# Patient Record
Sex: Female | Born: 1935 | Race: White | Hispanic: No | Marital: Married | Smoking: Never smoker
Health system: Southern US, Community
[De-identification: ages and names within clinical notes are randomized; demographics above are authoritative.]

## PROBLEM LIST (undated history)

## (undated) DIAGNOSIS — J45909 Unspecified asthma, uncomplicated: Secondary | ICD-10-CM

## (undated) DIAGNOSIS — Z9889 Other specified postprocedural states: Secondary | ICD-10-CM

## (undated) DIAGNOSIS — I10 Essential (primary) hypertension: Secondary | ICD-10-CM

## (undated) DIAGNOSIS — E7800 Pure hypercholesterolemia, unspecified: Secondary | ICD-10-CM

## (undated) HISTORY — PX: ABDOMINAL HYSTERECTOMY: SHX81

## (undated) HISTORY — PX: EYE SURGERY: SHX253

## (undated) HISTORY — PX: HERNIA REPAIR: SHX51

---

## 2005-04-05 ENCOUNTER — Ambulatory Visit: Payer: Self-pay | Admitting: Internal Medicine

## 2005-06-22 ENCOUNTER — Ambulatory Visit: Payer: Self-pay | Admitting: Internal Medicine

## 2006-02-02 ENCOUNTER — Ambulatory Visit: Payer: Self-pay | Admitting: Internal Medicine

## 2006-05-05 ENCOUNTER — Ambulatory Visit: Payer: Self-pay | Admitting: Internal Medicine

## 2007-03-02 ENCOUNTER — Ambulatory Visit: Payer: Self-pay | Admitting: Physician Assistant

## 2007-04-05 ENCOUNTER — Other Ambulatory Visit: Payer: Self-pay

## 2007-04-05 ENCOUNTER — Ambulatory Visit: Payer: Self-pay | Admitting: Unknown Physician Specialty

## 2007-04-14 ENCOUNTER — Inpatient Hospital Stay: Payer: Self-pay | Admitting: Unknown Physician Specialty

## 2007-04-14 HISTORY — PX: HEMIARTHROPLASTY SHOULDER FRACTURE: SUR653

## 2007-05-09 ENCOUNTER — Encounter: Payer: Self-pay | Admitting: Unknown Physician Specialty

## 2007-05-30 ENCOUNTER — Encounter: Payer: Self-pay | Admitting: Unknown Physician Specialty

## 2007-06-30 ENCOUNTER — Encounter: Payer: Self-pay | Admitting: Unknown Physician Specialty

## 2007-07-27 ENCOUNTER — Ambulatory Visit: Payer: Self-pay | Admitting: Internal Medicine

## 2008-07-29 ENCOUNTER — Ambulatory Visit: Payer: Self-pay | Admitting: Internal Medicine

## 2009-12-03 ENCOUNTER — Ambulatory Visit: Payer: Self-pay | Admitting: Internal Medicine

## 2010-02-17 ENCOUNTER — Emergency Department: Payer: Self-pay | Admitting: Emergency Medicine

## 2010-02-19 ENCOUNTER — Emergency Department: Payer: Self-pay | Admitting: Emergency Medicine

## 2010-02-25 ENCOUNTER — Ambulatory Visit: Payer: Self-pay | Admitting: Internal Medicine

## 2010-12-21 ENCOUNTER — Ambulatory Visit: Payer: Self-pay | Admitting: Internal Medicine

## 2011-06-26 ENCOUNTER — Emergency Department: Payer: Self-pay | Admitting: *Deleted

## 2011-12-29 ENCOUNTER — Ambulatory Visit: Payer: Self-pay | Admitting: Internal Medicine

## 2012-03-26 ENCOUNTER — Emergency Department: Payer: Self-pay | Admitting: Internal Medicine

## 2012-03-26 LAB — URINALYSIS, COMPLETE
Bacteria: NONE SEEN
Glucose,UR: NEGATIVE mg/dL (ref 0–75)
Ketone: NEGATIVE
Ph: 6 (ref 4.5–8.0)
Protein: 100
Specific Gravity: 1.014 (ref 1.003–1.030)
Squamous Epithelial: NONE SEEN
WBC UR: 395 /HPF (ref 0–5)

## 2012-12-29 ENCOUNTER — Ambulatory Visit: Payer: Self-pay | Admitting: Internal Medicine

## 2013-12-31 ENCOUNTER — Ambulatory Visit: Payer: Self-pay | Admitting: Internal Medicine

## 2015-11-10 ENCOUNTER — Other Ambulatory Visit: Payer: Self-pay | Admitting: Internal Medicine

## 2015-11-10 DIAGNOSIS — Z1231 Encounter for screening mammogram for malignant neoplasm of breast: Secondary | ICD-10-CM

## 2015-11-18 ENCOUNTER — Ambulatory Visit
Admission: RE | Admit: 2015-11-18 | Discharge: 2015-11-18 | Disposition: A | Discharge status: Home / Self Care | Payer: PPO | Source: Ambulatory Visit | Attending: Internal Medicine | Admitting: Internal Medicine

## 2015-11-18 DIAGNOSIS — Z1231 Encounter for screening mammogram for malignant neoplasm of breast: Secondary | ICD-10-CM | POA: Diagnosis present

## 2016-01-29 DIAGNOSIS — J019 Acute sinusitis, unspecified: Secondary | ICD-10-CM | POA: Diagnosis not present

## 2016-01-29 DIAGNOSIS — I10 Essential (primary) hypertension: Secondary | ICD-10-CM | POA: Diagnosis not present

## 2016-02-17 DIAGNOSIS — Z947 Corneal transplant status: Secondary | ICD-10-CM | POA: Diagnosis not present

## 2016-03-13 ENCOUNTER — Encounter: Payer: Self-pay | Admitting: Emergency Medicine

## 2016-03-13 ENCOUNTER — Emergency Department
Admission: EM | Admit: 2016-03-13 | Discharge: 2016-03-14 | Disposition: A | Discharge status: Home / Self Care | Payer: PPO | Attending: Emergency Medicine | Admitting: Emergency Medicine

## 2016-03-13 DIAGNOSIS — R197 Diarrhea, unspecified: Secondary | ICD-10-CM | POA: Diagnosis not present

## 2016-03-13 DIAGNOSIS — J45909 Unspecified asthma, uncomplicated: Secondary | ICD-10-CM | POA: Diagnosis not present

## 2016-03-13 DIAGNOSIS — R110 Nausea: Secondary | ICD-10-CM | POA: Diagnosis not present

## 2016-03-13 DIAGNOSIS — R1030 Lower abdominal pain, unspecified: Secondary | ICD-10-CM | POA: Diagnosis not present

## 2016-03-13 DIAGNOSIS — N200 Calculus of kidney: Secondary | ICD-10-CM | POA: Diagnosis not present

## 2016-03-13 DIAGNOSIS — J9811 Atelectasis: Secondary | ICD-10-CM | POA: Diagnosis not present

## 2016-03-13 DIAGNOSIS — I10 Essential (primary) hypertension: Secondary | ICD-10-CM | POA: Insufficient documentation

## 2016-03-13 HISTORY — DX: Essential (primary) hypertension: I10

## 2016-03-13 HISTORY — DX: Pure hypercholesterolemia, unspecified: E78.00

## 2016-03-13 HISTORY — DX: Unspecified asthma, uncomplicated: J45.909

## 2016-03-13 LAB — COMPREHENSIVE METABOLIC PANEL
ALK PHOS: 59 U/L (ref 38–126)
ALT: 25 U/L (ref 14–54)
ANION GAP: 6 (ref 5–15)
AST: 32 U/L (ref 15–41)
Albumin: 4.5 g/dL (ref 3.5–5.0)
BUN: 14 mg/dL (ref 6–20)
CALCIUM: 9.5 mg/dL (ref 8.9–10.3)
CHLORIDE: 104 mmol/L (ref 101–111)
CO2: 27 mmol/L (ref 22–32)
Creatinine, Ser: 0.66 mg/dL (ref 0.44–1.00)
GFR calc non Af Amer: >60 mL/min (ref >60)
Glucose, Bld: 143 mg/dL — ABNORMAL HIGH (ref 65–99)
Potassium: 4.4 mmol/L (ref 3.5–5.1)
SODIUM: 137 mmol/L (ref 135–145)
Total Bilirubin: 0.4 mg/dL (ref 0.3–1.2)
Total Protein: 7.9 g/dL (ref 6.5–8.1)

## 2016-03-13 LAB — CBC
HCT: 44.2 % (ref 35.0–47.0)
HEMOGLOBIN: 15 g/dL (ref 12.0–16.0)
MCH: 32.1 pg (ref 26.0–34.0)
MCHC: 33.9 g/dL (ref 32.0–36.0)
MCV: 94.5 fL (ref 80.0–100.0)
Platelets: 207 10*3/uL (ref 150–440)
RBC: 4.67 MIL/uL (ref 3.80–5.20)
RDW: 13 % (ref 11.5–14.5)
WBC: 8 10*3/uL (ref 3.6–11.0)

## 2016-03-13 LAB — URINALYSIS COMPLETE WITH MICROSCOPIC (ARMC ONLY)
Bacteria, UA: NONE SEEN
Bilirubin Urine: NEGATIVE
Glucose, UA: NEGATIVE mg/dL
HGB URINE DIPSTICK: NEGATIVE
KETONES UR: NEGATIVE mg/dL
Leukocytes, UA: NEGATIVE
Nitrite: NEGATIVE
PH: 6 (ref 5.0–8.0)
PROTEIN: NEGATIVE mg/dL
SPECIFIC GRAVITY, URINE: 1.017 (ref 1.005–1.030)

## 2016-03-13 LAB — LIPASE, BLOOD: Lipase: 25 U/L (ref 11–51)

## 2016-03-13 MED ORDER — ONDANSETRON 4 MG PO TBDP
4.0000 mg | ORAL_TABLET | Freq: Once | ORAL | Status: AC | PRN
  Administered 2016-03-13: 4 mg via ORAL

## 2016-03-13 MED ORDER — ONDANSETRON 4 MG PO TBDP
ORAL_TABLET | ORAL | Status: AC
  Administered 2016-03-13: 4 mg via ORAL
  Filled 2016-03-13: qty 1

## 2016-03-13 NOTE — ED Notes (Signed)
Pt presents to ED with diarrhea X4-5 and "gagging". Pt states she had a hernia repair that made her unable to vomit. Pt states she has been "gagging all day" like she "wants to throw up" but never does. Pt states her abdominal muscles are sore from heaving all day. Denies any other pain. Pt currently has no increased work of breathing or distress noted. Skin warm and dry.

## 2016-03-14 ENCOUNTER — Emergency Department: Payer: PPO

## 2016-03-14 DIAGNOSIS — J9811 Atelectasis: Secondary | ICD-10-CM | POA: Diagnosis not present

## 2016-03-14 DIAGNOSIS — N200 Calculus of kidney: Secondary | ICD-10-CM | POA: Diagnosis not present

## 2016-03-14 LAB — GASTROINTESTINAL PANEL BY PCR, STOOL (REPLACES STOOL CULTURE)
ASTROVIRUS: NOT DETECTED
Adenovirus F40/41: NOT DETECTED
CYCLOSPORA CAYETANENSIS: NOT DETECTED
Campylobacter species: NOT DETECTED
Cryptosporidium: NOT DETECTED
E. COLI O157: NOT DETECTED
ENTEROTOXIGENIC E COLI (ETEC): NOT DETECTED
Entamoeba histolytica: NOT DETECTED
Enteroaggregative E coli (EAEC): NOT DETECTED
Enteropathogenic E coli (EPEC): NOT DETECTED
Giardia lamblia: NOT DETECTED
NOROVIRUS GI/GII: NOT DETECTED
PLESIMONAS SHIGELLOIDES: NOT DETECTED
ROTAVIRUS A: DETECTED — AB
SAPOVIRUS (I, II, IV, AND V): NOT DETECTED
SHIGA LIKE TOXIN PRODUCING E COLI (STEC): NOT DETECTED
Salmonella species: NOT DETECTED
Shigella/Enteroinvasive E coli (EIEC): NOT DETECTED
Vibrio cholerae: NOT DETECTED
Vibrio species: NOT DETECTED
Yersinia enterocolitica: NOT DETECTED

## 2016-03-14 LAB — C DIFFICILE QUICK SCREEN W PCR REFLEX
C DIFFICILE (CDIFF) INTERP: NEGATIVE
C DIFFICILE (CDIFF) TOXIN: NEGATIVE
C DIFFICLE (CDIFF) ANTIGEN: NEGATIVE

## 2016-03-14 MED ORDER — DIATRIZOATE MEGLUMINE & SODIUM 66-10 % PO SOLN
15.0000 mL | Freq: Once | ORAL | Status: AC
  Administered 2016-03-14: 15 mL via ORAL
  Filled 2016-03-14: qty 30

## 2016-03-14 MED ORDER — IOPAMIDOL (ISOVUE-300) INJECTION 61%
100.0000 mL | Freq: Once | INTRAVENOUS | Status: AC | PRN
  Administered 2016-03-14: 100 mL via INTRAVENOUS

## 2016-03-14 MED ORDER — ONDANSETRON HCL 4 MG/2ML IJ SOLN
4.0000 mg | Freq: Once | INTRAMUSCULAR | Status: AC
  Administered 2016-03-14: 4 mg via INTRAVENOUS
  Filled 2016-03-14: qty 2

## 2016-03-14 MED ORDER — ONDANSETRON 4 MG PO TBDP: 4.0000 mg | ORAL_TABLET | Freq: Three times a day (TID) | ORAL | Status: DC | PRN

## 2016-03-14 MED ORDER — SODIUM CHLORIDE 0.9 % IV BOLUS (SEPSIS)
1000.0000 mL | Freq: Once | INTRAVENOUS | Status: AC
  Administered 2016-03-14: 1000 mL via INTRAVENOUS

## 2016-03-14 NOTE — ED Notes (Signed)
Patient transported to CT 

## 2016-03-14 NOTE — Discharge Instructions (Signed)
1. You may take Zofran as needed for nausea (#20). 2. Clear liquids 12 hours, then BRAT diet 3 days, then slowly advance diet as tolerated. 3. Return to the ER for worsening symptoms, persistent vomiting, difficulty breathing or other concerns.  Abdominal Pain, Adult Many things can cause abdominal pain. Usually, abdominal pain is not caused by a disease and will improve without treatment. It can often be observed and treated at home. Your health care provider will do a physical exam and possibly order blood tests and X-rays to help determine the seriousness of your pain. However, in many cases, more time must pass before a clear cause of the pain can be found. Before that point, your health care provider may not know if you need more testing or further treatment. HOME CARE INSTRUCTIONS Monitor your abdominal pain for any changes. The following actions may help to alleviate any discomfort you are experiencing:  Only take over-the-counter or prescription medicines as directed by your health care provider.  Do not take laxatives unless directed to do so by your health care provider.  Try a clear liquid diet (broth, tea, or water) as directed by your health care provider. Slowly move to a bland diet as tolerated. SEEK MEDICAL CARE IF:  You have unexplained abdominal pain.  You have abdominal pain associated with nausea or diarrhea.  You have pain when you urinate or have a bowel movement.  You experience abdominal pain that wakes you in the night.  You have abdominal pain that is worsened or improved by eating food.  You have abdominal pain that is worsened with eating fatty foods.  You have a fever. SEEK IMMEDIATE MEDICAL CARE IF:  Your pain does not go away within 2 hours.  You keep throwing up (vomiting).  Your pain is felt only in portions of the abdomen, such as the right side or the left lower portion of the abdomen.  You pass bloody or black tarry stools. MAKE SURE  YOU:  Understand these instructions.  Will watch your condition.  Will get help right away if you are not doing well or get worse.   This information is not intended to replace advice given to you by your health care provider. Make sure you discuss any questions you have with your health care provider.   Document Released: 08/25/2005 Document Revised: 08/06/2015 Document Reviewed: 07/25/2013 Elsevier Interactive Patient Education 2016 Elsevier Inc.  Diarrhea Diarrhea is frequent loose and watery bowel movements. It can cause you to feel weak and dehydrated. Dehydration can cause you to become tired and thirsty, have a dry mouth, and have decreased urination that often is dark yellow. Diarrhea is a sign of another problem, most often an infection that will not last long. In most cases, diarrhea typically lasts 2-3 days. However, it can last longer if it is a sign of something more serious. It is important to treat your diarrhea as directed by your caregiver to lessen or prevent future episodes of diarrhea. CAUSES  Some common causes include:  Gastrointestinal infections caused by viruses, bacteria, or parasites.  Food poisoning or food allergies.  Certain medicines, such as antibiotics, chemotherapy, and laxatives.  Artificial sweeteners and fructose.  Digestive disorders. HOME CARE INSTRUCTIONS  Ensure adequate fluid intake (hydration): Have 1 cup (8 oz) of fluid for each diarrhea episode. Avoid fluids that contain simple sugars or sports drinks, fruit juices, whole milk products, and sodas. Your urine should be clear or pale yellow if you are drinking enough fluids.  Hydrate with an oral rehydration solution that you can purchase at pharmacies, retail stores, and online. You can prepare an oral rehydration solution at home by mixing the following ingredients together:   - tsp table salt.   tsp baking soda.   tsp salt substitute containing potassium chloride.  1  tablespoons  sugar.  1 L (34 oz) of water.  Certain foods and beverages may increase the speed at which food moves through the gastrointestinal (GI) tract. These foods and beverages should be avoided and include:  Caffeinated and alcoholic beverages.  High-fiber foods, such as raw fruits and vegetables, nuts, seeds, and whole grain breads and cereals.  Foods and beverages sweetened with sugar alcohols, such as xylitol, sorbitol, and mannitol.  Some foods may be well tolerated and may help thicken stool including:  Starchy foods, such as rice, toast, pasta, low-sugar cereal, oatmeal, grits, baked potatoes, crackers, and bagels.  Bananas.  Applesauce.  Add probiotic-rich foods to help increase healthy bacteria in the GI tract, such as yogurt and fermented milk products.  Wash your hands well after each diarrhea episode.  Only take over-the-counter or prescription medicines as directed by your caregiver.  Take a warm bath to relieve any burning or pain from frequent diarrhea episodes. SEEK IMMEDIATE MEDICAL CARE IF:   You are unable to keep fluids down.  You have persistent vomiting.  You have blood in your stool, or your stools are black and tarry.  You do not urinate in 6-8 hours, or there is only a small amount of very dark urine.  You have abdominal pain that increases or localizes.  You have weakness, dizziness, confusion, or light-headedness.  You have a severe headache.  Your diarrhea gets worse or does not get better.  You have a fever or persistent symptoms for more than 2-3 days.  You have a fever and your symptoms suddenly get worse. MAKE SURE YOU:   Understand these instructions.  Will watch your condition.  Will get help right away if you are not doing well or get worse.   This information is not intended to replace advice given to you by your health care provider. Make sure you discuss any questions you have with your health care provider.   Document Released:  11/05/2002 Document Revised: 12/06/2014 Document Reviewed: 07/23/2012 Elsevier Interactive Patient Education 2016 [employer]  Food Choices to Help Relieve Diarrhea, Adult When you have diarrhea, the foods you eat and your eating habits are very important. Choosing the right foods and drinks can help relieve diarrhea. Also, because diarrhea can last up to 7 days, you need to replace lost fluids and electrolytes (such as sodium, potassium, and chloride) in order to help prevent dehydration.  WHAT GENERAL GUIDELINES DO I NEED TO FOLLOW?  Slowly drink 1 cup (8 oz) of fluid for each episode of diarrhea. If you are getting enough fluid, your urine will be clear or pale yellow.  Eat starchy foods. Some good choices include white rice, white toast, pasta, low-fiber cereal, baked potatoes (without the skin), saltine crackers, and bagels.  Avoid large servings of any cooked vegetables.  Limit fruit to two servings per day. A serving is  cup or 1 small piece.  Choose foods with less than 2 g of fiber per serving.  Limit fats to less than 8 tsp (38 g) per day.  Avoid fried foods.  Eat foods that have probiotics in them. Probiotics can be found in certain dairy products.  Avoid foods and beverages  that may increase the speed at which food moves through the stomach and intestines (gastrointestinal tract). Things to avoid include:  High-fiber foods, such as dried fruit, raw fruits and vegetables, nuts, seeds, and whole grain foods.  Spicy foods and high-fat foods.  Foods and beverages sweetened with high-fructose corn syrup, honey, or sugar alcohols such as xylitol, sorbitol, and mannitol. WHAT FOODS ARE RECOMMENDED? Grains White rice. White, [place], or pita breads (fresh or toasted), including plain rolls, buns, or bagels. White pasta. Saltine, soda, or graham crackers. Pretzels. Low-fiber cereal. Cooked cereals made with water (such as cornmeal, farina, or cream cereals). Plain muffins.  Matzo. Melba toast. Zwieback.  Vegetables Potatoes (without the skin). Strained tomato and vegetable juices. Most well-cooked and canned vegetables without seeds. Tender lettuce. Fruits Cooked or canned applesauce, apricots, cherries, fruit cocktail, grapefruit, peaches, pears, or plums. Fresh bananas, apples without skin, cherries, grapes, cantaloupe, grapefruit, peaches, oranges, or plums.  Meat and Other Protein Products Baked or boiled chicken. Eggs. Tofu. Fish. Seafood. Smooth peanut butter. Ground or well-cooked tender beef, ham, veal, lamb, pork, or poultry.  Dairy Plain yogurt, kefir, and unsweetened liquid yogurt. Lactose-free milk, buttermilk, or soy milk. Plain hard cheese. Beverages Sport drinks. Clear broths. Diluted fruit juices (except prune). Regular, caffeine-free sodas such as ginger ale. Water. Decaffeinated teas. Oral rehydration solutions. Sugar-free beverages not sweetened with sugar alcohols. Other Bouillon, broth, or soups made from recommended foods.  The items listed above may not be a complete list of recommended foods or beverages. Contact your dietitian for more options. WHAT FOODS ARE NOT RECOMMENDED? Grains Whole grain, whole wheat, bran, or rye breads, rolls, pastas, crackers, and cereals. Wild or brown rice. Cereals that contain more than 2 g of fiber per serving. Corn tortillas or taco shells. Cooked or dry oatmeal. Granola. Popcorn. Vegetables Raw vegetables. Cabbage, broccoli, Brussels sprouts, artichokes, baked beans, beet greens, corn, kale, legumes, peas, sweet potatoes, and yams. Potato skins. Cooked spinach and cabbage. Fruits Dried fruit, including raisins and dates. Raw fruits. Stewed or dried prunes. Fresh apples with skin, apricots, mangoes, pears, raspberries, and strawberries.  Meat and Other Protein Products Chunky peanut butter. Nuts and seeds. Beans and lentils. Tomasa Blase.  Dairy High-fat cheeses. Milk, chocolate milk, and beverages made with  milk, such as milk shakes. Cream. Ice cream. Sweets and Desserts Sweet rolls, doughnuts, and sweet breads. Pancakes and waffles. Fats and Oils Butter. Cream sauces. Margarine. Salad oils. Plain salad dressings. Olives. Avocados.  Beverages Caffeinated beverages (such as coffee, tea, soda, or energy drinks). Alcoholic beverages. Fruit juices with pulp. Prune juice. Soft drinks sweetened with high-fructose corn syrup or sugar alcohols. Other Coconut. Hot sauce. Chili powder. Mayonnaise. Gravy. Cream-based or milk-based soups.  The items listed above may not be a complete list of foods and beverages to avoid. Contact your dietitian for more information. WHAT SHOULD I DO IF I BECOME DEHYDRATED? Diarrhea can sometimes lead to dehydration. Signs of dehydration include dark urine and dry mouth and skin. If you think you are dehydrated, you should rehydrate with an oral rehydration solution. These solutions can be purchased at pharmacies, retail stores, or online.  Drink -1 cup (120-240 mL) of oral rehydration solution each time you have an episode of diarrhea. If drinking this amount makes your diarrhea worse, try drinking smaller amounts more often. For example, drink 1-3 tsp (5-15 mL) every 5-10 minutes.  A general rule for staying hydrated is to drink 1-2 L of fluid per day. Talk to your health care  provider about the specific amount you should be drinking each day. Drink enough fluids to keep your urine clear or pale yellow.   This information is not intended to replace advice given to you by your health care provider. Make sure you discuss any questions you have with your health care provider.   Document Released: 02/05/2004 Document Revised: 12/06/2014 Document Reviewed: 10/08/2013 Elsevier Interactive Patient Education 2016 Elsevier Inc.  Nausea, Adult Nausea is the feeling that you have an upset stomach or have to vomit. Nausea by itself is not likely a serious concern, but it may be an  early sign of more serious medical problems. As nausea gets worse, it can lead to vomiting. If vomiting develops, there is the risk of dehydration.  CAUSES   Viral infections.  Food poisoning.  Medicines.  Pregnancy.  Motion sickness.  Migraine headaches.  Emotional distress.  Severe pain from any source.  Alcohol intoxication. HOME CARE INSTRUCTIONS  Get plenty of rest.  Ask your caregiver about specific rehydration instructions.  Eat small amounts of food and sip liquids more often.  Take all medicines as told by your caregiver. SEEK MEDICAL CARE IF:  You have not improved after 2 days, or you get worse.  You have a headache. SEEK IMMEDIATE MEDICAL CARE IF:   You have a fever.  You faint.  You keep vomiting or have blood in your vomit.  You are extremely weak or dehydrated.  You have dark or bloody stools.  You have severe chest or abdominal pain. MAKE SURE YOU:  Understand these instructions.  Will watch your condition.  Will get help right away if you are not doing well or get worse.   This information is not intended to replace advice given to you by your health care provider. Make sure you discuss any questions you have with your health care provider.   Document Released: 12/23/2004 Document Revised: 12/06/2014 Document Reviewed: 07/28/2011 Elsevier Interactive Patient Education [employer]

## 2016-03-14 NOTE — ED Provider Notes (Signed)
[facility] Emergency Department Provider Note  ____________________________________________  Time seen: Approximately 12:06 AM  I have reviewed the triage vital signs and the nursing notes.   HISTORY  Chief Complaint Diarrhea and Emesis    HPI Christie Holmes is a 82 y.o. female who presents to the ED from home with a chief complain of nausea, dry heaving and diarrhea. Patient reports she awoke approximately midnight 24 hours ago with nausea. She states she is unable to vomit secondary to hiatal hernia repair in the past. Reports she has been "gagging" all day. Symptoms associated with 4-5 episodes of diarrhea and lower abdominal cramping. Denies associated fever, chills, chest pain, shortness of breath, dysuria. Denies recent travel or trauma. Also complains of dry cough. Received Zofran in waiting room which has improved her nausea; last episode of diarrhea approximately 6 PM.   Past Medical History  Diagnosis Date  . Hypertension   . Asthma   . Hypercholesteremia     There are no active problems to display for this patient.   Past Surgical History  Procedure Laterality Date  . Hernia repair    . Abdominal hysterectomy      No current outpatient prescriptions on file.  Allergies Review of patient's allergies indicates no known allergies.  Family History  Problem Relation Age of Onset  . Breast cancer Neg Hx     Social History Social History  Substance Use Topics  . Smoking status: Never Smoker   . Smokeless tobacco: None  . Alcohol Use: No    Review of Systems  Constitutional: No fever/chills. Eyes: No visual changes. ENT: No sore throat. Cardiovascular: Denies chest pain. Respiratory: Positive for dry cough. Denies shortness of breath. Gastrointestinal: Positive for lower abdominal pain, nausea and diarrhea.  No constipation. Genitourinary: Negative for dysuria. Musculoskeletal: Negative for back pain. Skin: Negative for  rash. Neurological: Negative for headaches, focal weakness or numbness.  10-point ROS otherwise negative.  ____________________________________________   PHYSICAL EXAM:  VITAL SIGNS: ED Triage Vitals  Enc Vitals Group     BP 03/13/16 1923 151/79 mmHg     Pulse Rate 03/13/16 1923 109     Resp 03/13/16 1923 18     Temp 03/13/16 1923 97.6 F (36.4 C)     Temp Source 03/13/16 1923 Oral     SpO2 03/13/16 1923 97 %     Weight 03/13/16 1923 143 lb (64.864 kg)     Height 03/13/16 1923 4\' 9"  (1.448 m)     Head Cir --      Peak Flow --      Pain Score 03/13/16 1918 9     Pain Loc --      Pain Edu? --      Excl. in GC? --     Constitutional: Alert and oriented. Well appearing and in no acute distress. Eyes: Conjunctivae are normal. PERRL. EOMI. Head: Atraumatic. Nose: No congestion/rhinnorhea. Mouth/Throat: Mucous membranes are mildly dry.  Oropharynx non-erythematous. Neck: No stridor.  No carotid bruits. Cardiovascular: Normal rate, regular rhythm. Grossly normal heart sounds.  Good peripheral circulation. Respiratory: Normal respiratory effort.  No retractions. Lungs CTAB. Gastrointestinal: Soft and nontender. No distention. No abdominal bruits. No CVA tenderness. Musculoskeletal: No lower extremity tenderness nor edema.  No joint effusions. Neurologic:  Normal speech and language. No gross focal neurologic deficits are appreciated. No gait instability. Skin:  Skin is warm, dry and intact. No rash noted. Psychiatric: Mood and affect are normal. Speech and behavior are  normal.  ____________________________________________   LABS (all labs ordered are listed, but only abnormal results are displayed)  Labs Reviewed  COMPREHENSIVE METABOLIC PANEL - Abnormal; Notable for the following:    Glucose, Bld 143 (*)    All other components within normal limits  URINALYSIS COMPLETEWITH MICROSCOPIC ([facility] ONLY) - Abnormal; Notable for the following:    Color, Urine YELLOW (*)     APPearance CLEAR (*)    Squamous Epithelial / LPF 0-5 (*)    All other components within normal limits  GASTROINTESTINAL PANEL BY PCR, STOOL (REPLACES STOOL CULTURE)  C DIFFICILE QUICK SCREEN W PCR REFLEX  LIPASE, BLOOD  CBC   ____________________________________________  EKG  ED ECG REPORT I, Keishaun Hazel J, the attending physician, personally viewed and interpreted this ECG.   Date: 03/14/2016  EKG Time: 0049  Rate: 105  Rhythm: sinus tachycardia  Axis: Normal  Intervals:none  ST&T Change: Nonspecific  ____________________________________________  RADIOLOGY  Chest 2 view (viewed by me, interpreted per Dr. Cherly Hensen): 1. Minimal bibasilar atelectasis noted. Lungs otherwise grossly clear. 2. Stomach partially distended with fluid and air.  CT Abdomen/Pelvis interpreted per Dr. Cherly Hensen: 1. No acute abnormality seen to explain the patient's symptoms. 2. 2 mm nonobstructing stone at the interpole region of the left right kidney. Left renal cysts noted. 3. Mild calcification along the abdominal aorta. 4. Minimal degenerative change along the lower thoracic and lumbar spine. ____________________________________________   PROCEDURES  Procedure(s) performed: None  Critical Care performed: No  ____________________________________________   INITIAL IMPRESSION / ASSESSMENT AND PLAN / ED COURSE  Pertinent labs & imaging results that were available during my care of the patient were reviewed by me and considered in my medical decision making (see chart for details).  82 year old female who presents with nausea and diarrhea. Laboratory and urinalysis results unremarkable. Given patient's history of abdominal surgeries, will obtain CT abdomen/pelvis to evaluate for intra-abdominal pathology.  ----------------------------------------- 2:39 AM on 03/14/2016 -----------------------------------------  Patient resting in no acute distress. Updated patient and daughter of CT  findings. Plan for Zofran prescription and close follow-up with PCP. Patient has been unable to provide stool sample while in the emergency department. Strict return precautions given. Both verbalize understanding and agree with plan of care. ____________________________________________   FINAL CLINICAL IMPRESSION(S) / ED DIAGNOSES  Final diagnoses:  Lower abdominal pain  Diarrhea, unspecified type  Nausea      Irean Hong, MD 03/14/16 [phone]

## 2016-03-15 DIAGNOSIS — K529 Noninfective gastroenteritis and colitis, unspecified: Secondary | ICD-10-CM | POA: Diagnosis not present

## 2016-04-27 DIAGNOSIS — E7800 Pure hypercholesterolemia, unspecified: Secondary | ICD-10-CM | POA: Diagnosis not present

## 2016-04-27 DIAGNOSIS — E538 Deficiency of other specified B group vitamins: Secondary | ICD-10-CM | POA: Diagnosis not present

## 2016-04-27 DIAGNOSIS — I10 Essential (primary) hypertension: Secondary | ICD-10-CM | POA: Diagnosis not present

## 2016-04-27 DIAGNOSIS — I700 Atherosclerosis of aorta: Secondary | ICD-10-CM | POA: Diagnosis not present

## 2016-05-04 DIAGNOSIS — I700 Atherosclerosis of aorta: Secondary | ICD-10-CM | POA: Diagnosis not present

## 2016-05-04 DIAGNOSIS — F325 Major depressive disorder, single episode, in full remission: Secondary | ICD-10-CM | POA: Diagnosis not present

## 2016-05-04 DIAGNOSIS — I10 Essential (primary) hypertension: Secondary | ICD-10-CM | POA: Diagnosis not present

## 2016-05-04 DIAGNOSIS — J4520 Mild intermittent asthma, uncomplicated: Secondary | ICD-10-CM | POA: Diagnosis not present

## 2016-05-04 DIAGNOSIS — E538 Deficiency of other specified B group vitamins: Secondary | ICD-10-CM | POA: Diagnosis not present

## 2016-05-04 DIAGNOSIS — E7800 Pure hypercholesterolemia, unspecified: Secondary | ICD-10-CM | POA: Diagnosis not present

## 2016-07-01 DIAGNOSIS — M79672 Pain in left foot: Secondary | ICD-10-CM | POA: Diagnosis not present

## 2016-07-01 DIAGNOSIS — M7672 Peroneal tendinitis, left leg: Secondary | ICD-10-CM | POA: Diagnosis not present

## 2016-08-17 DIAGNOSIS — Z947 Corneal transplant status: Secondary | ICD-10-CM | POA: Diagnosis not present

## 2016-09-13 DIAGNOSIS — Z23 Encounter for immunization: Secondary | ICD-10-CM | POA: Diagnosis not present

## 2016-11-02 DIAGNOSIS — E538 Deficiency of other specified B group vitamins: Secondary | ICD-10-CM | POA: Diagnosis not present

## 2016-11-02 DIAGNOSIS — E7800 Pure hypercholesterolemia, unspecified: Secondary | ICD-10-CM | POA: Diagnosis not present

## 2016-11-02 DIAGNOSIS — I10 Essential (primary) hypertension: Secondary | ICD-10-CM | POA: Diagnosis not present

## 2016-11-09 DIAGNOSIS — E7800 Pure hypercholesterolemia, unspecified: Secondary | ICD-10-CM | POA: Diagnosis not present

## 2016-11-09 DIAGNOSIS — Z0000 Encounter for general adult medical examination without abnormal findings: Secondary | ICD-10-CM | POA: Diagnosis not present

## 2016-11-09 DIAGNOSIS — I10 Essential (primary) hypertension: Secondary | ICD-10-CM | POA: Diagnosis not present

## 2016-11-09 DIAGNOSIS — I700 Atherosclerosis of aorta: Secondary | ICD-10-CM | POA: Diagnosis not present

## 2016-11-09 DIAGNOSIS — Z8659 Personal history of other mental and behavioral disorders: Secondary | ICD-10-CM | POA: Diagnosis not present

## 2016-11-09 DIAGNOSIS — E538 Deficiency of other specified B group vitamins: Secondary | ICD-10-CM | POA: Diagnosis not present

## 2017-02-10 DIAGNOSIS — R8290 Unspecified abnormal findings in urine: Secondary | ICD-10-CM | POA: Diagnosis not present

## 2017-02-10 DIAGNOSIS — R300 Dysuria: Secondary | ICD-10-CM | POA: Diagnosis not present

## 2017-03-28 DIAGNOSIS — Z947 Corneal transplant status: Secondary | ICD-10-CM | POA: Diagnosis not present

## 2017-03-31 DIAGNOSIS — H60331 Swimmer's ear, right ear: Secondary | ICD-10-CM | POA: Diagnosis not present

## 2017-03-31 DIAGNOSIS — R309 Painful micturition, unspecified: Secondary | ICD-10-CM | POA: Diagnosis not present

## 2017-03-31 DIAGNOSIS — H6121 Impacted cerumen, right ear: Secondary | ICD-10-CM | POA: Diagnosis not present

## 2017-05-03 DIAGNOSIS — E538 Deficiency of other specified B group vitamins: Secondary | ICD-10-CM | POA: Diagnosis not present

## 2017-05-03 DIAGNOSIS — E7800 Pure hypercholesterolemia, unspecified: Secondary | ICD-10-CM | POA: Diagnosis not present

## 2017-05-03 DIAGNOSIS — Z0000 Encounter for general adult medical examination without abnormal findings: Secondary | ICD-10-CM | POA: Diagnosis not present

## 2017-05-03 DIAGNOSIS — I10 Essential (primary) hypertension: Secondary | ICD-10-CM | POA: Diagnosis not present

## 2017-05-10 DIAGNOSIS — I10 Essential (primary) hypertension: Secondary | ICD-10-CM | POA: Diagnosis not present

## 2017-05-10 DIAGNOSIS — I700 Atherosclerosis of aorta: Secondary | ICD-10-CM | POA: Diagnosis not present

## 2017-05-10 DIAGNOSIS — J45909 Unspecified asthma, uncomplicated: Secondary | ICD-10-CM | POA: Diagnosis not present

## 2017-05-10 DIAGNOSIS — Z0000 Encounter for general adult medical examination without abnormal findings: Secondary | ICD-10-CM | POA: Diagnosis not present

## 2017-05-10 DIAGNOSIS — E7800 Pure hypercholesterolemia, unspecified: Secondary | ICD-10-CM | POA: Diagnosis not present

## 2017-05-10 DIAGNOSIS — E538 Deficiency of other specified B group vitamins: Secondary | ICD-10-CM | POA: Diagnosis not present

## 2017-05-30 DIAGNOSIS — H60541 Acute eczematoid otitis externa, right ear: Secondary | ICD-10-CM | POA: Diagnosis not present

## 2017-10-10 DIAGNOSIS — Z961 Presence of intraocular lens: Secondary | ICD-10-CM | POA: Diagnosis not present

## 2017-11-02 DIAGNOSIS — I10 Essential (primary) hypertension: Secondary | ICD-10-CM | POA: Diagnosis not present

## 2017-11-02 DIAGNOSIS — E7800 Pure hypercholesterolemia, unspecified: Secondary | ICD-10-CM | POA: Diagnosis not present

## 2017-11-02 DIAGNOSIS — E538 Deficiency of other specified B group vitamins: Secondary | ICD-10-CM | POA: Diagnosis not present

## 2017-11-02 DIAGNOSIS — I700 Atherosclerosis of aorta: Secondary | ICD-10-CM | POA: Diagnosis not present

## 2017-11-14 DIAGNOSIS — J45909 Unspecified asthma, uncomplicated: Secondary | ICD-10-CM | POA: Diagnosis not present

## 2017-11-14 DIAGNOSIS — I10 Essential (primary) hypertension: Secondary | ICD-10-CM | POA: Diagnosis not present

## 2017-11-14 DIAGNOSIS — I700 Atherosclerosis of aorta: Secondary | ICD-10-CM | POA: Diagnosis not present

## 2017-11-14 DIAGNOSIS — E7800 Pure hypercholesterolemia, unspecified: Secondary | ICD-10-CM | POA: Diagnosis not present

## 2018-01-16 ENCOUNTER — Other Ambulatory Visit: Payer: Self-pay | Admitting: Physician Assistant

## 2018-01-16 ENCOUNTER — Ambulatory Visit
Admission: RE | Admit: 2018-01-16 | Discharge: 2018-01-16 | Disposition: A | Discharge status: Home / Self Care | Payer: PPO | Source: Ambulatory Visit | Attending: Physician Assistant | Admitting: Physician Assistant

## 2018-01-16 DIAGNOSIS — N200 Calculus of kidney: Secondary | ICD-10-CM | POA: Diagnosis not present

## 2018-01-16 DIAGNOSIS — R109 Unspecified abdominal pain: Secondary | ICD-10-CM | POA: Diagnosis not present

## 2018-01-16 DIAGNOSIS — R1031 Right lower quadrant pain: Secondary | ICD-10-CM | POA: Diagnosis not present

## 2018-01-16 DIAGNOSIS — R350 Frequency of micturition: Secondary | ICD-10-CM | POA: Diagnosis not present

## 2018-01-16 DIAGNOSIS — I700 Atherosclerosis of aorta: Secondary | ICD-10-CM | POA: Diagnosis not present

## 2018-02-23 DIAGNOSIS — J00 Acute nasopharyngitis [common cold]: Secondary | ICD-10-CM | POA: Diagnosis not present

## 2018-04-11 DIAGNOSIS — Z947 Corneal transplant status: Secondary | ICD-10-CM | POA: Diagnosis not present

## 2018-05-11 DIAGNOSIS — I700 Atherosclerosis of aorta: Secondary | ICD-10-CM | POA: Diagnosis not present

## 2018-05-11 DIAGNOSIS — I10 Essential (primary) hypertension: Secondary | ICD-10-CM | POA: Diagnosis not present

## 2018-05-11 DIAGNOSIS — E7800 Pure hypercholesterolemia, unspecified: Secondary | ICD-10-CM | POA: Diagnosis not present

## 2018-05-18 DIAGNOSIS — I700 Atherosclerosis of aorta: Secondary | ICD-10-CM | POA: Diagnosis not present

## 2018-05-18 DIAGNOSIS — E7800 Pure hypercholesterolemia, unspecified: Secondary | ICD-10-CM | POA: Diagnosis not present

## 2018-05-18 DIAGNOSIS — J45909 Unspecified asthma, uncomplicated: Secondary | ICD-10-CM | POA: Diagnosis not present

## 2018-05-18 DIAGNOSIS — R350 Frequency of micturition: Secondary | ICD-10-CM | POA: Diagnosis not present

## 2018-05-18 DIAGNOSIS — I10 Essential (primary) hypertension: Secondary | ICD-10-CM | POA: Diagnosis not present

## 2018-05-18 DIAGNOSIS — Z0000 Encounter for general adult medical examination without abnormal findings: Secondary | ICD-10-CM | POA: Diagnosis not present

## 2018-06-19 DIAGNOSIS — E7800 Pure hypercholesterolemia, unspecified: Secondary | ICD-10-CM | POA: Diagnosis not present

## 2018-06-19 DIAGNOSIS — I10 Essential (primary) hypertension: Secondary | ICD-10-CM | POA: Diagnosis not present

## 2018-06-19 DIAGNOSIS — E538 Deficiency of other specified B group vitamins: Secondary | ICD-10-CM | POA: Diagnosis not present

## 2018-06-19 DIAGNOSIS — J9811 Atelectasis: Secondary | ICD-10-CM | POA: Diagnosis not present

## 2018-06-19 DIAGNOSIS — J45909 Unspecified asthma, uncomplicated: Secondary | ICD-10-CM | POA: Diagnosis not present

## 2018-06-19 DIAGNOSIS — I700 Atherosclerosis of aorta: Secondary | ICD-10-CM | POA: Diagnosis not present

## 2018-07-25 DIAGNOSIS — R6889 Other general symptoms and signs: Secondary | ICD-10-CM | POA: Diagnosis not present

## 2018-07-25 DIAGNOSIS — R040 Epistaxis: Secondary | ICD-10-CM | POA: Diagnosis not present

## 2018-07-27 DIAGNOSIS — R0982 Postnasal drip: Secondary | ICD-10-CM | POA: Diagnosis not present

## 2018-07-27 DIAGNOSIS — R490 Dysphonia: Secondary | ICD-10-CM | POA: Diagnosis not present

## 2018-07-27 DIAGNOSIS — R040 Epistaxis: Secondary | ICD-10-CM | POA: Diagnosis not present

## 2018-10-09 DIAGNOSIS — Z961 Presence of intraocular lens: Secondary | ICD-10-CM | POA: Diagnosis not present

## 2018-11-09 DIAGNOSIS — E7800 Pure hypercholesterolemia, unspecified: Secondary | ICD-10-CM | POA: Diagnosis not present

## 2018-11-09 DIAGNOSIS — I700 Atherosclerosis of aorta: Secondary | ICD-10-CM | POA: Diagnosis not present

## 2018-11-09 DIAGNOSIS — I10 Essential (primary) hypertension: Secondary | ICD-10-CM | POA: Diagnosis not present

## 2018-11-16 DIAGNOSIS — I10 Essential (primary) hypertension: Secondary | ICD-10-CM | POA: Diagnosis not present

## 2018-11-16 DIAGNOSIS — I700 Atherosclerosis of aorta: Secondary | ICD-10-CM | POA: Diagnosis not present

## 2018-11-16 DIAGNOSIS — J45909 Unspecified asthma, uncomplicated: Secondary | ICD-10-CM | POA: Diagnosis not present

## 2018-11-16 DIAGNOSIS — E538 Deficiency of other specified B group vitamins: Secondary | ICD-10-CM | POA: Diagnosis not present

## 2018-11-16 DIAGNOSIS — E7800 Pure hypercholesterolemia, unspecified: Secondary | ICD-10-CM | POA: Diagnosis not present

## 2019-01-25 DIAGNOSIS — J310 Chronic rhinitis: Secondary | ICD-10-CM | POA: Diagnosis not present

## 2019-01-25 DIAGNOSIS — J45909 Unspecified asthma, uncomplicated: Secondary | ICD-10-CM | POA: Diagnosis not present

## 2019-02-05 DIAGNOSIS — J45901 Unspecified asthma with (acute) exacerbation: Secondary | ICD-10-CM | POA: Diagnosis not present

## 2019-02-05 DIAGNOSIS — J4541 Moderate persistent asthma with (acute) exacerbation: Secondary | ICD-10-CM | POA: Diagnosis not present

## 2019-02-26 ENCOUNTER — Other Ambulatory Visit: Payer: Self-pay | Admitting: Internal Medicine

## 2019-02-26 DIAGNOSIS — R1310 Dysphagia, unspecified: Secondary | ICD-10-CM | POA: Diagnosis not present

## 2019-02-26 DIAGNOSIS — J45901 Unspecified asthma with (acute) exacerbation: Secondary | ICD-10-CM

## 2019-02-28 DIAGNOSIS — N183 Chronic kidney disease, stage 3 unspecified: Secondary | ICD-10-CM | POA: Diagnosis not present

## 2019-02-28 DIAGNOSIS — E1122 Type 2 diabetes mellitus with diabetic chronic kidney disease: Secondary | ICD-10-CM | POA: Diagnosis not present

## 2019-02-28 DIAGNOSIS — J4521 Mild intermittent asthma with (acute) exacerbation: Secondary | ICD-10-CM | POA: Diagnosis not present

## 2019-02-28 DIAGNOSIS — D649 Anemia, unspecified: Secondary | ICD-10-CM | POA: Diagnosis not present

## 2019-02-28 DIAGNOSIS — J449 Chronic obstructive pulmonary disease, unspecified: Secondary | ICD-10-CM | POA: Diagnosis not present

## 2019-02-28 DIAGNOSIS — E782 Mixed hyperlipidemia: Secondary | ICD-10-CM | POA: Diagnosis not present

## 2019-02-28 DIAGNOSIS — D696 Thrombocytopenia, unspecified: Secondary | ICD-10-CM | POA: Diagnosis not present

## 2019-02-28 DIAGNOSIS — I2510 Atherosclerotic heart disease of native coronary artery without angina pectoris: Secondary | ICD-10-CM | POA: Diagnosis not present

## 2019-02-28 DIAGNOSIS — F17218 Nicotine dependence, cigarettes, with other nicotine-induced disorders: Secondary | ICD-10-CM | POA: Diagnosis not present

## 2019-02-28 DIAGNOSIS — N400 Enlarged prostate without lower urinary tract symptoms: Secondary | ICD-10-CM | POA: Diagnosis not present

## 2019-02-28 DIAGNOSIS — K760 Fatty (change of) liver, not elsewhere classified: Secondary | ICD-10-CM | POA: Diagnosis not present

## 2019-02-28 DIAGNOSIS — R0602 Shortness of breath: Secondary | ICD-10-CM | POA: Diagnosis not present

## 2019-02-28 DIAGNOSIS — E039 Hypothyroidism, unspecified: Secondary | ICD-10-CM | POA: Diagnosis not present

## 2019-02-28 DIAGNOSIS — I129 Hypertensive chronic kidney disease with stage 1 through stage 4 chronic kidney disease, or unspecified chronic kidney disease: Secondary | ICD-10-CM | POA: Diagnosis not present

## 2019-03-19 DIAGNOSIS — J310 Chronic rhinitis: Secondary | ICD-10-CM | POA: Diagnosis not present

## 2019-03-19 DIAGNOSIS — R42 Dizziness and giddiness: Secondary | ICD-10-CM | POA: Diagnosis not present

## 2019-03-19 DIAGNOSIS — J45901 Unspecified asthma with (acute) exacerbation: Secondary | ICD-10-CM | POA: Diagnosis not present

## 2019-03-19 DIAGNOSIS — R0602 Shortness of breath: Secondary | ICD-10-CM | POA: Diagnosis not present

## 2019-03-19 DIAGNOSIS — J4520 Mild intermittent asthma, uncomplicated: Secondary | ICD-10-CM | POA: Diagnosis not present

## 2019-04-03 ENCOUNTER — Ambulatory Visit: Payer: PPO

## 2019-04-10 ENCOUNTER — Other Ambulatory Visit: Payer: Self-pay

## 2019-04-10 ENCOUNTER — Other Ambulatory Visit: Payer: Self-pay | Admitting: Internal Medicine

## 2019-04-10 ENCOUNTER — Ambulatory Visit
Admission: RE | Admit: 2019-04-10 | Discharge: 2019-04-10 | Disposition: A | Discharge status: Home / Self Care | Payer: PPO | Source: Ambulatory Visit | Attending: Internal Medicine | Admitting: Internal Medicine

## 2019-04-10 DIAGNOSIS — R1310 Dysphagia, unspecified: Secondary | ICD-10-CM | POA: Diagnosis not present

## 2019-04-10 DIAGNOSIS — J45901 Unspecified asthma with (acute) exacerbation: Secondary | ICD-10-CM

## 2019-04-10 DIAGNOSIS — K449 Diaphragmatic hernia without obstruction or gangrene: Secondary | ICD-10-CM | POA: Diagnosis not present

## 2019-04-10 DIAGNOSIS — K224 Dyskinesia of esophagus: Secondary | ICD-10-CM | POA: Diagnosis not present

## 2019-05-14 DIAGNOSIS — E7800 Pure hypercholesterolemia, unspecified: Secondary | ICD-10-CM | POA: Diagnosis not present

## 2019-05-14 DIAGNOSIS — I10 Essential (primary) hypertension: Secondary | ICD-10-CM | POA: Diagnosis not present

## 2019-05-14 DIAGNOSIS — I700 Atherosclerosis of aorta: Secondary | ICD-10-CM | POA: Diagnosis not present

## 2019-05-21 DIAGNOSIS — R300 Dysuria: Secondary | ICD-10-CM | POA: Diagnosis not present

## 2019-05-21 DIAGNOSIS — I700 Atherosclerosis of aorta: Secondary | ICD-10-CM | POA: Diagnosis not present

## 2019-05-21 DIAGNOSIS — I10 Essential (primary) hypertension: Secondary | ICD-10-CM | POA: Diagnosis not present

## 2019-05-21 DIAGNOSIS — Z0000 Encounter for general adult medical examination without abnormal findings: Secondary | ICD-10-CM | POA: Diagnosis not present

## 2019-05-21 DIAGNOSIS — E538 Deficiency of other specified B group vitamins: Secondary | ICD-10-CM | POA: Diagnosis not present

## 2019-05-21 DIAGNOSIS — E7800 Pure hypercholesterolemia, unspecified: Secondary | ICD-10-CM | POA: Diagnosis not present

## 2019-05-21 DIAGNOSIS — J45901 Unspecified asthma with (acute) exacerbation: Secondary | ICD-10-CM | POA: Diagnosis not present

## 2019-06-28 DIAGNOSIS — Z947 Corneal transplant status: Secondary | ICD-10-CM | POA: Diagnosis not present

## 2019-07-10 DIAGNOSIS — M25474 Effusion, right foot: Secondary | ICD-10-CM | POA: Diagnosis not present

## 2019-07-10 DIAGNOSIS — S93491A Sprain of other ligament of right ankle, initial encounter: Secondary | ICD-10-CM | POA: Diagnosis not present

## 2019-07-10 DIAGNOSIS — M79671 Pain in right foot: Secondary | ICD-10-CM | POA: Diagnosis not present

## 2019-07-10 DIAGNOSIS — R262 Difficulty in walking, not elsewhere classified: Secondary | ICD-10-CM | POA: Diagnosis not present

## 2019-07-10 DIAGNOSIS — M7671 Peroneal tendinitis, right leg: Secondary | ICD-10-CM | POA: Diagnosis not present

## 2019-07-17 IMAGING — RF ESOPHAGUS/BARIUM SWALLOW/TABLET STUDY
8 of 11 series · 14 of 24 positions shown · non-contrast
Comparison: 06/22/2005 upper GI

CLINICAL DATA: Dysphagia, worse with solid foods

EXAM:
ESOPHOGRAM/BARIUM SWALLOW
TECHNIQUE: Combined double contrast and single contrast examination performed
using effervescent crystals, thick barium liquid, and thin barium
liquid.
FLUOROSCOPY TIME:  Fluoroscopy Time:  2 minutes 12 seconds
Radiation Exposure Index (if provided by the fluoroscopic device):
52 mGy

[Series 1: fluoro_barium 2fps_bw · 0.18mm/px · 2 of 7 frames shown (1 of 4)]
[frame 1/7]
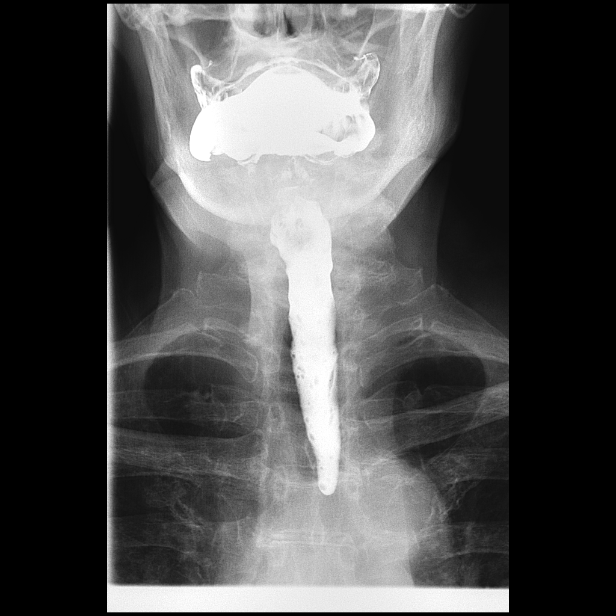
[frame 4/7]
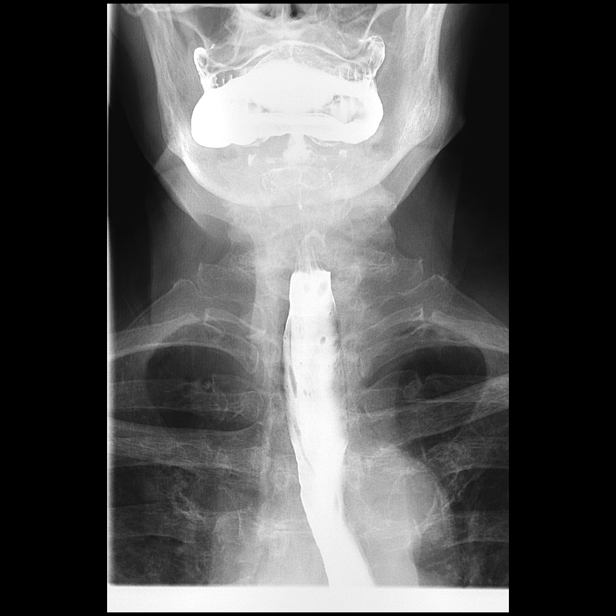

[Series 2: fluoro_barium 2fps_bw · 0.18mm/px · 2 of 5 frames shown (2 of 4)]
[frame 1/5]
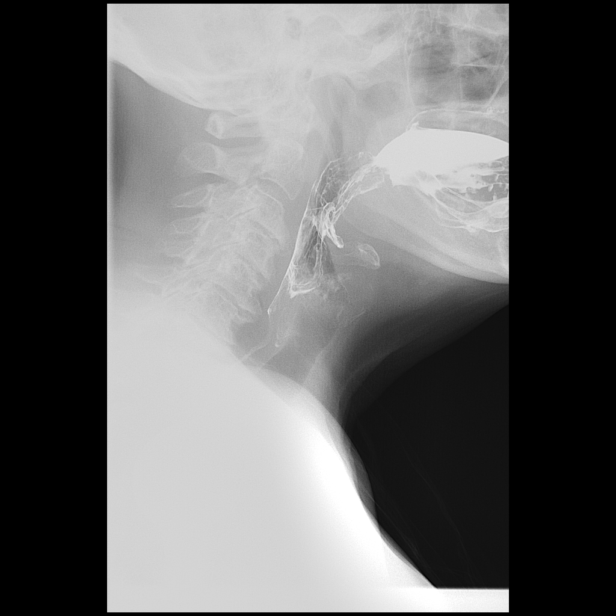
[frame 5/5]
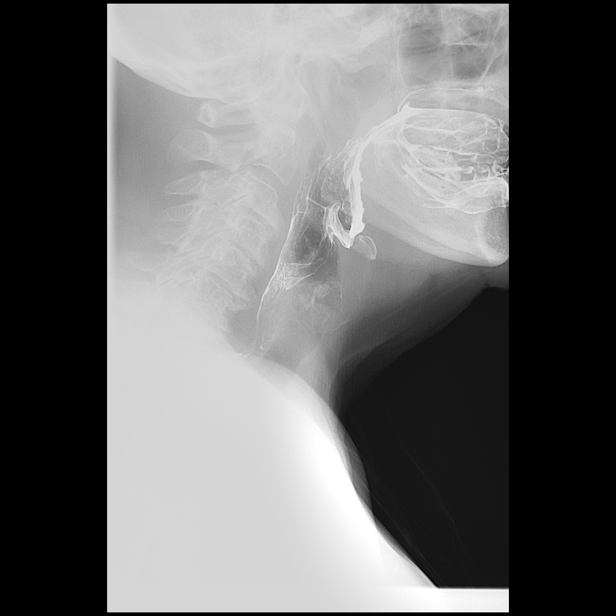

[Series 3: fluoro_barium 2fps_bw · 0.18mm/px · 2 of 11 frames shown (3 of 4)]
[frame 1/11]
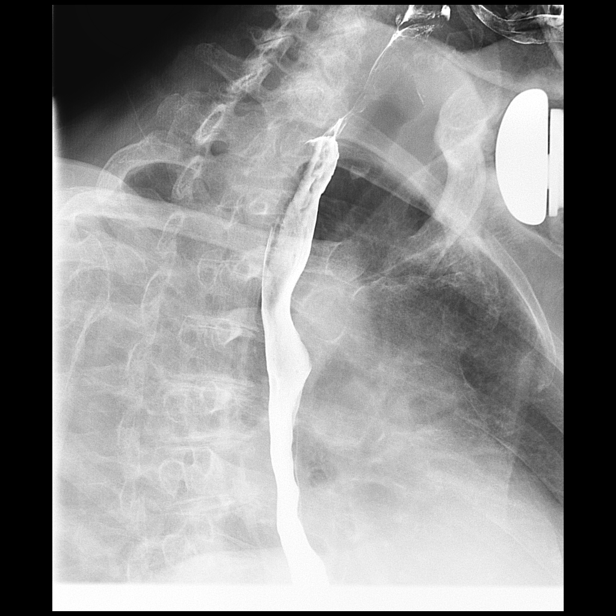
[frame 6/11]
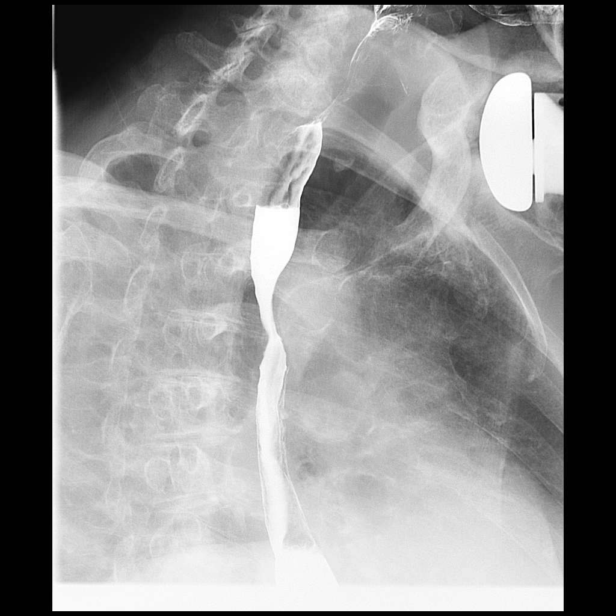

[Series 4: fluoro_barium 2fps_bw · 0.18mm/px · 3 of 12 frames shown (4 of 4)]
[frame 2/12]
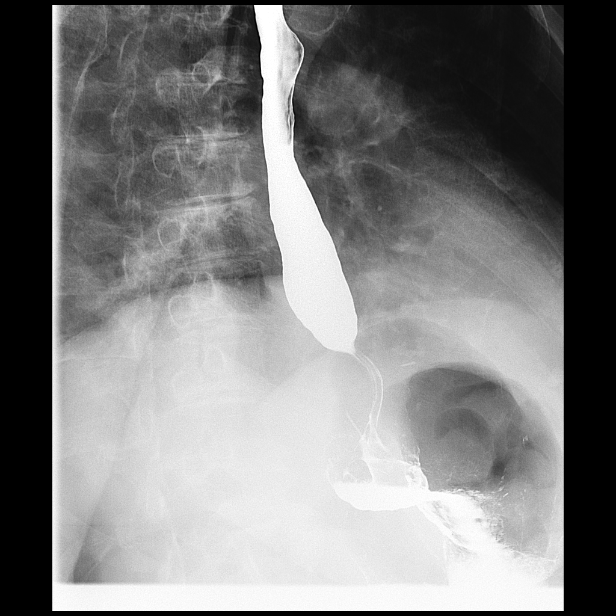
[frame 6/12]
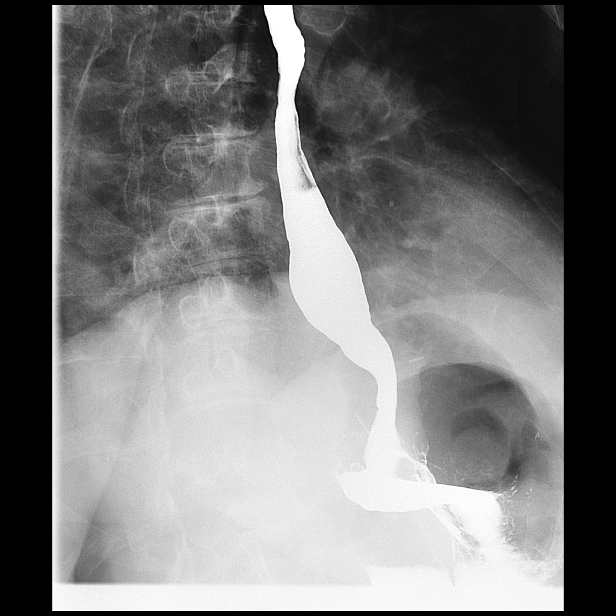
[frame 11/12]
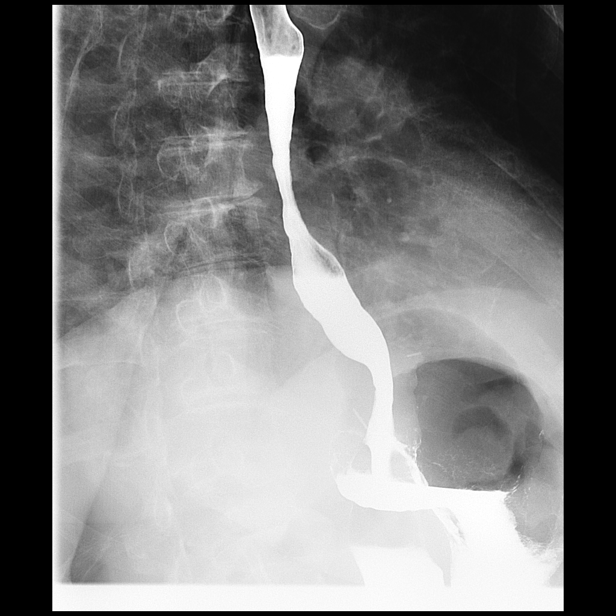

[Series 6: cp_standard · 0.18mm/px · 1 of 1 slices shown (1 of 4)]
[im 1/1]
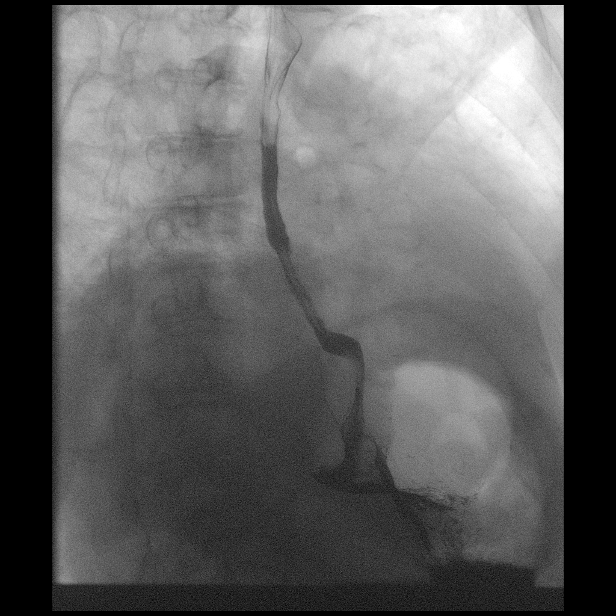

[Series 8: cp_standard · 0.19mm/px · 2 of 20 frames shown (2 of 4)]
[frame 10/20]
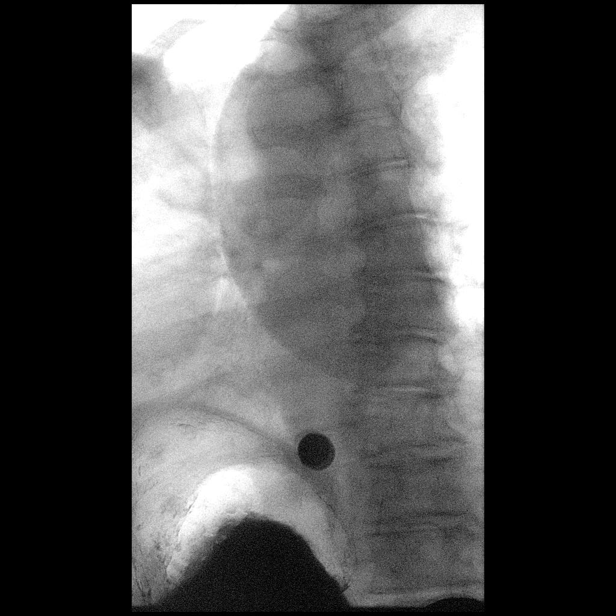
[frame 11/20]
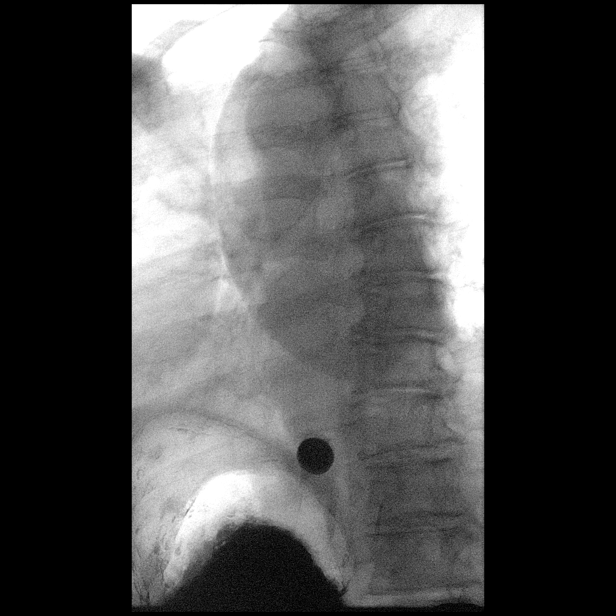

[Series 9: cp_standard · 0.19mm/px · 1 of 1 slices shown (3 of 4)]
[im 1/1]
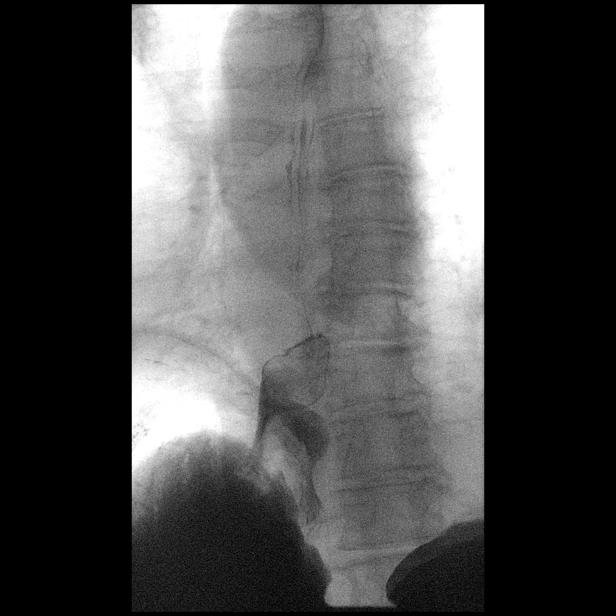

[Series 11: cp_standard · 0.19mm/px · 1 of 1 slices shown (4 of 4)]
[im 1/1]
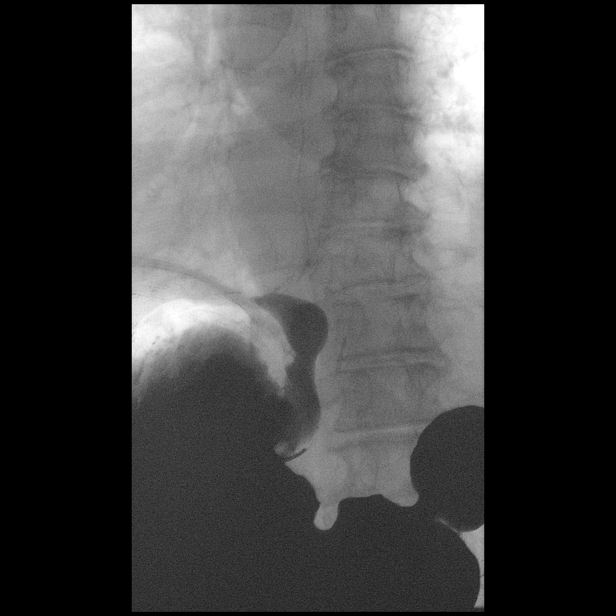

[14 of 24 positions shown; findings below may reference images not displayed]

FINDINGS: Initial single-contrast esophagram of the cervical esophagus
demonstrates patency of the cervical esophagus without aspiration.

Double contrast esophagram demonstrates a patent esophagus without
obstruction or significant stricture. No esophageal fold thickening.
During the exam there is disruption of the esophagus primary
peristalsis with tertiary contractions compatible with nonspecific
dysmotility. This results in slow passage of the barium tablet but
eventually the tablet does pass into the stomach. Small hiatal
hernia noted. Minor reflux noted with the patient prone.
IMPRESSION: Nonspecific esophageal dysmotility. Small hiatal hernia and minor
esophageal reflux.

Negative for obstruction or significant stricture.

## 2019-07-24 DIAGNOSIS — S93491D Sprain of other ligament of right ankle, subsequent encounter: Secondary | ICD-10-CM | POA: Diagnosis not present

## 2019-07-24 DIAGNOSIS — M7671 Peroneal tendinitis, right leg: Secondary | ICD-10-CM | POA: Diagnosis not present

## 2019-08-14 DIAGNOSIS — S93491D Sprain of other ligament of right ankle, subsequent encounter: Secondary | ICD-10-CM | POA: Diagnosis not present

## 2019-08-14 DIAGNOSIS — M7671 Peroneal tendinitis, right leg: Secondary | ICD-10-CM | POA: Diagnosis not present

## 2019-10-15 DIAGNOSIS — J019 Acute sinusitis, unspecified: Secondary | ICD-10-CM | POA: Diagnosis not present

## 2019-11-13 DIAGNOSIS — I10 Essential (primary) hypertension: Secondary | ICD-10-CM | POA: Diagnosis not present

## 2019-11-13 DIAGNOSIS — E785 Hyperlipidemia, unspecified: Secondary | ICD-10-CM | POA: Diagnosis not present

## 2019-11-14 ENCOUNTER — Emergency Department
Admission: EM | Admit: 2019-11-14 | Discharge: 2019-11-14 | Disposition: A | Discharge status: Home / Self Care | Payer: PPO | Attending: Emergency Medicine | Admitting: Emergency Medicine

## 2019-11-14 ENCOUNTER — Other Ambulatory Visit: Payer: Self-pay

## 2019-11-14 DIAGNOSIS — J45909 Unspecified asthma, uncomplicated: Secondary | ICD-10-CM | POA: Diagnosis not present

## 2019-11-14 DIAGNOSIS — I10 Essential (primary) hypertension: Secondary | ICD-10-CM | POA: Diagnosis not present

## 2019-11-14 DIAGNOSIS — K5641 Fecal impaction: Secondary | ICD-10-CM

## 2019-11-14 DIAGNOSIS — K6289 Other specified diseases of anus and rectum: Secondary | ICD-10-CM | POA: Diagnosis present

## 2019-11-14 NOTE — ED Notes (Signed)
Disimpaction done by Ron PA, with this RN at bedside.  Stool removed from room and pt assisted to restroom to attempt bowel movement.

## 2019-11-14 NOTE — ED Triage Notes (Signed)
Pt states it had been 2 days since she had a BM so last night she took a stool softener and epison salt, states "its right there but I cant get it out".. states it hurts to sit down.

## 2019-11-14 NOTE — ED Provider Notes (Signed)
[facility] Emergency Department Provider Note   ____________________________________________   First MD Initiated Contact with Patient 11/14/19 0957     (approximate)  I have reviewed the triage vital signs and the nursing notes.   HISTORY  Chief Complaint Fecal Impaction    HPI Christie Holmes is a 85 y.o. female patient presents with no BM for 2 days.  Patient states she uses a stool softener on a daily basis.  Patient states it hurts to sit down as she can feel that the stools at the distal rectum.  Patient states she was able to produce a "small ball" last night.  And a small leakage of soft stool.  Rates her pain discomfort as a 7/10.  Patient describes her pain as "pressure/fullness".         Past Medical History:  Diagnosis Date  . Asthma   . Hypercholesteremia   . Hypertension     There are no problems to display for this patient.   Past Surgical History:  Procedure Laterality Date  . ABDOMINAL HYSTERECTOMY    . HERNIA REPAIR      Prior to Admission medications   Medication Sig Start Date End Date Taking? Authorizing Provider  ondansetron (ZOFRAN ODT) 4 MG disintegrating tablet Take 1 tablet (4 mg total) by mouth every 8 (eight) hours as needed for nausea or vomiting. 03/14/16   Paulette Blanch, MD    Allergies Patient has no known allergies.  Family History  Problem Relation Age of Onset  . Breast cancer Neg Hx     Social History Social History   Tobacco Use  . Smoking status: Never Smoker  . Smokeless tobacco: Never Used  Substance Use Topics  . Alcohol use: No  . Drug use: No    Review of Systems Constitutional: No fever/chills Eyes: No visual changes. ENT: No sore throat. Cardiovascular: Denies chest pain. Respiratory: Denies shortness of breath. Gastrointestinal: No abdominal pain.  No nausea, no vomiting.  constipation. Genitourinary: Negative for dysuria. Musculoskeletal: Negative for back pain. Skin:  Negative for rash. Neurological: Negative for headaches, focal weakness or numbness. Endocrine: Hyperlipidemia and hypertension.  ____________________________________________   PHYSICAL EXAM:  VITAL SIGNS: ED Triage Vitals  Enc Vitals Group     BP 11/14/19 0946 115/69     Pulse Rate 11/14/19 0946 73     Resp 11/14/19 0946 17     Temp 11/14/19 0946 97.8 F (36.6 C)     Temp Source 11/14/19 0946 Oral     SpO2 11/14/19 0946 98 %     Weight 11/14/19 0947 135 lb (61.2 kg)     Height 11/14/19 0947 4\' 9"  (1.448 m)     Head Circumference --      Peak Flow --      Pain Score 11/14/19 0947 7     Pain Loc --      Pain Edu? --      Excl. in [place]? --    Constitutional: Alert and oriented. Well appearing and in no acute distress. Cardiovascular: Normal rate, regular rhythm. Grossly normal heart sounds.  Good peripheral circulation. Respiratory: Normal respiratory effort.  No retractions. Lungs CTAB. Gastrointestinal: Soft and nontender. No distention. No abdominal bruits. No CVA tenderness.  Palpable stool in the rectum. Neurologic:  Normal speech and language. No gross focal neurologic deficits are appreciated. No gait instability. Skin:  Skin is warm, dry and intact. No rash noted. Psychiatric: Mood and affect are normal. Speech and behavior  are normal.  ____________________________________________   LABS (all labs ordered are listed, but only abnormal results are displayed)  Labs Reviewed - No data to display ____________________________________________  EKG   ____________________________________________  RADIOLOGY  ED MD interpretation:    Official radiology report(s): No results found.  ____________________________________________   PROCEDURES  Procedure(s) performed (including Critical Care):  Fecal disimpaction  Date/Time: 11/14/2019 10:25 AM Performed by: Joni Reining, PA-C Authorized by: Joni Reining, PA-C  Consent: Verbal consent obtained. Consent  given by: patient Patient understanding: patient states understanding of the procedure being performed Relevant documents: relevant documents present and verified Patient identity confirmed: verbally with patient and arm band Time out: Immediately prior to procedure a "time out" was called to verify the correct patient, procedure, equipment, support staff and site/side marked as required. Local anesthesia used: no  Anesthesia: Local anesthesia used: no  Sedation: Patient sedated: no  Patient tolerance: patient tolerated the procedure well with no immediate complications      ____________________________________________   INITIAL IMPRESSION / ASSESSMENT AND PLAN / ED COURSE  As part of my medical decision making, I reviewed the following data within the electronic MEDICAL RECORD NUMBER     Patient presents with constipation and fecal impaction.  Impaction removed digitally and patient had a full bowel movement after procedure.  Patient presents with fecal impaction requiring digital D impaction.  See procedure note.  Patient was able to have a full bowel movement with complete relief for her complaint.  Patient given discharge care instructions advised follow-up PCP.   [name] was evaluated in Emergency Department on 11/14/2019 for the symptoms described in the history of present illness. She was evaluated in the context of the global COVID-19 pandemic, which necessitated consideration that the patient might be at risk for infection with the SARS-CoV-2 virus that causes COVID-19. Institutional protocols and algorithms that pertain to the evaluation of patients at risk for COVID-19 are in a state of rapid change based on information released by regulatory bodies including the CDC and federal and state organizations. These policies and algorithms were followed during the patient's care in the ED.       ____________________________________________   FINAL CLINICAL IMPRESSION(S) /  ED DIAGNOSES  Final diagnoses:  Fecal impaction in rectum [facility])     ED Discharge Orders    None       Note:  This document was prepared using Dragon voice recognition software and may include unintentional dictation errors.    Joni Reining, PA-C 11/14/19 1027    Shaune Pollack, MD 11/16/19 [phone]

## 2019-11-14 NOTE — ED Notes (Signed)
Pt had large bowel movement in toilet. Daughter called and informed patient will be discharged and can come pick her up.

## 2019-11-14 NOTE — Discharge Instructions (Addendum)
Follow discharge care instructions and continue previous medications. °

## 2019-11-20 DIAGNOSIS — E785 Hyperlipidemia, unspecified: Secondary | ICD-10-CM | POA: Diagnosis not present

## 2019-11-20 DIAGNOSIS — E538 Deficiency of other specified B group vitamins: Secondary | ICD-10-CM | POA: Diagnosis not present

## 2019-11-20 DIAGNOSIS — J45901 Unspecified asthma with (acute) exacerbation: Secondary | ICD-10-CM | POA: Diagnosis not present

## 2019-11-20 DIAGNOSIS — I10 Essential (primary) hypertension: Secondary | ICD-10-CM | POA: Diagnosis not present

## 2019-11-20 DIAGNOSIS — F3341 Major depressive disorder, recurrent, in partial remission: Secondary | ICD-10-CM | POA: Diagnosis not present

## 2019-11-20 DIAGNOSIS — I700 Atherosclerosis of aorta: Secondary | ICD-10-CM | POA: Diagnosis not present

## 2019-12-25 DIAGNOSIS — Z947 Corneal transplant status: Secondary | ICD-10-CM | POA: Diagnosis not present

## 2020-02-11 DIAGNOSIS — J45901 Unspecified asthma with (acute) exacerbation: Secondary | ICD-10-CM | POA: Diagnosis not present

## 2020-03-25 DIAGNOSIS — M19071 Primary osteoarthritis, right ankle and foot: Secondary | ICD-10-CM | POA: Diagnosis not present

## 2020-03-25 DIAGNOSIS — M19072 Primary osteoarthritis, left ankle and foot: Secondary | ICD-10-CM | POA: Diagnosis not present

## 2020-05-14 DIAGNOSIS — I10 Essential (primary) hypertension: Secondary | ICD-10-CM | POA: Diagnosis not present

## 2020-05-14 DIAGNOSIS — E538 Deficiency of other specified B group vitamins: Secondary | ICD-10-CM | POA: Diagnosis not present

## 2020-05-14 DIAGNOSIS — E785 Hyperlipidemia, unspecified: Secondary | ICD-10-CM | POA: Diagnosis not present

## 2020-05-21 DIAGNOSIS — M533 Sacrococcygeal disorders, not elsewhere classified: Secondary | ICD-10-CM | POA: Diagnosis not present

## 2020-05-21 DIAGNOSIS — E538 Deficiency of other specified B group vitamins: Secondary | ICD-10-CM | POA: Diagnosis not present

## 2020-05-21 DIAGNOSIS — I700 Atherosclerosis of aorta: Secondary | ICD-10-CM | POA: Diagnosis not present

## 2020-05-21 DIAGNOSIS — E785 Hyperlipidemia, unspecified: Secondary | ICD-10-CM | POA: Diagnosis not present

## 2020-05-21 DIAGNOSIS — Z0000 Encounter for general adult medical examination without abnormal findings: Secondary | ICD-10-CM | POA: Diagnosis not present

## 2020-05-21 DIAGNOSIS — R102 Pelvic and perineal pain: Secondary | ICD-10-CM | POA: Diagnosis not present

## 2020-05-21 DIAGNOSIS — F3341 Major depressive disorder, recurrent, in partial remission: Secondary | ICD-10-CM | POA: Diagnosis not present

## 2020-05-21 DIAGNOSIS — I10 Essential (primary) hypertension: Secondary | ICD-10-CM | POA: Diagnosis not present

## 2020-08-05 DIAGNOSIS — Z961 Presence of intraocular lens: Secondary | ICD-10-CM | POA: Diagnosis not present

## 2020-09-18 DIAGNOSIS — J019 Acute sinusitis, unspecified: Secondary | ICD-10-CM | POA: Diagnosis not present

## 2020-09-18 DIAGNOSIS — R634 Abnormal weight loss: Secondary | ICD-10-CM | POA: Diagnosis not present

## 2020-11-12 DIAGNOSIS — I10 Essential (primary) hypertension: Secondary | ICD-10-CM | POA: Diagnosis not present

## 2020-11-12 DIAGNOSIS — E785 Hyperlipidemia, unspecified: Secondary | ICD-10-CM | POA: Diagnosis not present

## 2020-11-19 DIAGNOSIS — F3341 Major depressive disorder, recurrent, in partial remission: Secondary | ICD-10-CM | POA: Diagnosis not present

## 2020-11-19 DIAGNOSIS — E538 Deficiency of other specified B group vitamins: Secondary | ICD-10-CM | POA: Diagnosis not present

## 2020-11-19 DIAGNOSIS — E785 Hyperlipidemia, unspecified: Secondary | ICD-10-CM | POA: Diagnosis not present

## 2020-11-19 DIAGNOSIS — I10 Essential (primary) hypertension: Secondary | ICD-10-CM | POA: Diagnosis not present

## 2020-11-19 DIAGNOSIS — I700 Atherosclerosis of aorta: Secondary | ICD-10-CM | POA: Diagnosis not present

## 2020-11-19 DIAGNOSIS — J45901 Unspecified asthma with (acute) exacerbation: Secondary | ICD-10-CM | POA: Diagnosis not present

## 2021-02-02 DIAGNOSIS — Z947 Corneal transplant status: Secondary | ICD-10-CM | POA: Diagnosis not present

## 2021-02-09 DIAGNOSIS — F3341 Major depressive disorder, recurrent, in partial remission: Secondary | ICD-10-CM | POA: Diagnosis not present

## 2021-02-09 DIAGNOSIS — E785 Hyperlipidemia, unspecified: Secondary | ICD-10-CM | POA: Diagnosis not present

## 2021-02-09 DIAGNOSIS — M5489 Other dorsalgia: Secondary | ICD-10-CM | POA: Diagnosis not present

## 2021-02-09 DIAGNOSIS — J45901 Unspecified asthma with (acute) exacerbation: Secondary | ICD-10-CM | POA: Diagnosis not present

## 2021-02-09 DIAGNOSIS — I700 Atherosclerosis of aorta: Secondary | ICD-10-CM | POA: Diagnosis not present

## 2021-02-09 DIAGNOSIS — M533 Sacrococcygeal disorders, not elsewhere classified: Secondary | ICD-10-CM | POA: Diagnosis not present

## 2021-02-09 DIAGNOSIS — M47816 Spondylosis without myelopathy or radiculopathy, lumbar region: Secondary | ICD-10-CM | POA: Diagnosis not present

## 2021-02-09 DIAGNOSIS — M160 Bilateral primary osteoarthritis of hip: Secondary | ICD-10-CM | POA: Diagnosis not present

## 2021-02-09 DIAGNOSIS — I10 Essential (primary) hypertension: Secondary | ICD-10-CM | POA: Diagnosis not present

## 2021-02-12 DIAGNOSIS — M1612 Unilateral primary osteoarthritis, left hip: Secondary | ICD-10-CM | POA: Diagnosis not present

## 2021-02-12 DIAGNOSIS — M5416 Radiculopathy, lumbar region: Secondary | ICD-10-CM | POA: Diagnosis not present

## 2021-02-12 DIAGNOSIS — M25552 Pain in left hip: Secondary | ICD-10-CM | POA: Diagnosis not present

## 2021-03-11 DIAGNOSIS — J45901 Unspecified asthma with (acute) exacerbation: Secondary | ICD-10-CM | POA: Diagnosis not present

## 2021-03-19 DIAGNOSIS — J45901 Unspecified asthma with (acute) exacerbation: Secondary | ICD-10-CM | POA: Diagnosis not present

## 2021-03-19 DIAGNOSIS — J45909 Unspecified asthma, uncomplicated: Secondary | ICD-10-CM | POA: Diagnosis not present

## 2021-03-19 DIAGNOSIS — I700 Atherosclerosis of aorta: Secondary | ICD-10-CM | POA: Diagnosis not present

## 2021-03-19 DIAGNOSIS — E785 Hyperlipidemia, unspecified: Secondary | ICD-10-CM | POA: Diagnosis not present

## 2021-03-19 DIAGNOSIS — F3341 Major depressive disorder, recurrent, in partial remission: Secondary | ICD-10-CM | POA: Diagnosis not present

## 2021-03-19 DIAGNOSIS — I10 Essential (primary) hypertension: Secondary | ICD-10-CM | POA: Diagnosis not present

## 2021-04-22 ENCOUNTER — Other Ambulatory Visit: Payer: Self-pay

## 2021-04-22 ENCOUNTER — Emergency Department: Payer: PPO

## 2021-04-22 ENCOUNTER — Emergency Department
Admission: EM | Admit: 2021-04-22 | Discharge: 2021-04-22 | Disposition: A | Discharge status: Home / Self Care | Payer: PPO | Attending: Emergency Medicine | Admitting: Emergency Medicine

## 2021-04-22 DIAGNOSIS — Y92512 Supermarket, store or market as the place of occurrence of the external cause: Secondary | ICD-10-CM | POA: Insufficient documentation

## 2021-04-22 DIAGNOSIS — W01198A Fall on same level from slipping, tripping and stumbling with subsequent striking against other object, initial encounter: Secondary | ICD-10-CM | POA: Diagnosis not present

## 2021-04-22 DIAGNOSIS — S0101XA Laceration without foreign body of scalp, initial encounter: Secondary | ICD-10-CM | POA: Diagnosis not present

## 2021-04-22 DIAGNOSIS — J45909 Unspecified asthma, uncomplicated: Secondary | ICD-10-CM | POA: Diagnosis not present

## 2021-04-22 DIAGNOSIS — Y9389 Activity, other specified: Secondary | ICD-10-CM | POA: Insufficient documentation

## 2021-04-22 DIAGNOSIS — W19XXXA Unspecified fall, initial encounter: Secondary | ICD-10-CM | POA: Diagnosis not present

## 2021-04-22 DIAGNOSIS — I498 Other specified cardiac arrhythmias: Secondary | ICD-10-CM | POA: Diagnosis not present

## 2021-04-22 DIAGNOSIS — R0902 Hypoxemia: Secondary | ICD-10-CM | POA: Diagnosis not present

## 2021-04-22 DIAGNOSIS — I10 Essential (primary) hypertension: Secondary | ICD-10-CM | POA: Diagnosis not present

## 2021-04-22 DIAGNOSIS — S0990XA Unspecified injury of head, initial encounter: Secondary | ICD-10-CM | POA: Diagnosis not present

## 2021-04-22 LAB — URINALYSIS, COMPLETE (UACMP) WITH MICROSCOPIC
Bacteria, UA: NONE SEEN
Bilirubin Urine: NEGATIVE
Glucose, UA: NEGATIVE mg/dL
Hgb urine dipstick: NEGATIVE
Ketones, ur: NEGATIVE mg/dL
Leukocytes,Ua: NEGATIVE
Nitrite: NEGATIVE
Protein, ur: NEGATIVE mg/dL
Specific Gravity, Urine: 1.005 (ref 1.005–1.030)
WBC, UA: NONE SEEN WBC/hpf (ref 0–5)
pH: 8 (ref 5.0–8.0)

## 2021-04-22 LAB — CBC
HCT: 38.7 % (ref 36.0–46.0)
Hemoglobin: 12.8 g/dL (ref 12.0–15.0)
MCH: 30.8 pg (ref 26.0–34.0)
MCHC: 33.1 g/dL (ref 30.0–36.0)
MCV: 93 fL (ref 80.0–100.0)
Platelets: 245 10*3/uL (ref 150–400)
RBC: 4.16 MIL/uL (ref 3.87–5.11)
RDW: 13.1 % (ref 11.5–15.5)
WBC: 7.8 10*3/uL (ref 4.0–10.5)
nRBC: 0 % (ref 0.0–0.2)

## 2021-04-22 LAB — BASIC METABOLIC PANEL
Anion gap: 9 (ref 5–15)
BUN: 16 mg/dL (ref 8–23)
CO2: 27 mmol/L (ref 22–32)
Calcium: 9.5 mg/dL (ref 8.9–10.3)
Chloride: 102 mmol/L (ref 98–111)
Creatinine, Ser: 0.74 mg/dL (ref 0.44–1.00)
GFR, Estimated: >60 mL/min (ref >60)
Glucose, Bld: 83 mg/dL (ref 70–99)
Potassium: 4.2 mmol/L (ref 3.5–5.1)
Sodium: 138 mmol/L (ref 135–145)

## 2021-04-22 LAB — PROTIME-INR
INR: 1 (ref 0.8–1.2)
Prothrombin Time: 13.4 seconds (ref 11.4–15.2)

## 2021-04-22 MED ORDER — ACETAMINOPHEN 500 MG PO TABS
1000.0000 mg | ORAL_TABLET | Freq: Once | ORAL | Status: AC
  Administered 2021-04-22: 1000 mg via ORAL
  Filled 2021-04-22: qty 2

## 2021-04-22 NOTE — ED Triage Notes (Signed)
Pt to ED [facility] from grocery store, fell and hit head, bleeding noted. +blood thinners.  Denies LOC Unsure what caused fall  Pt alert and oriented, clear speech

## 2021-04-22 NOTE — Discharge Instructions (Signed)
Use Tylenol for pain and fevers.  Up to 1000 mg per dose, up to 4 times per day.  Do not take more than 4000 mg of Tylenol/acetaminophen within 24 hours..  As we discussed, that 1 staple will need to be removed in about 10 days.  This can be done at any doctor's office, urgent care or out front in the ED.  If you develop any fevers, passing out or recurrent bleeding, please return to the ED.

## 2021-04-22 NOTE — ED Notes (Signed)
Pt c/o throbbing head pain and slight dizziness at this time. MD Katrinka Blazing notified. Per MD Katrinka Blazing, verbal order for 1000mg  of PO tylenol at this time.

## 2021-04-22 NOTE — ED Notes (Signed)
Pt verbalized understanding of d/c instructions. Follow-up care and pain management discussed. Pt assisted to ED lobby via wheelchair by Ladona Ridgel RN at this time.

## 2021-04-22 NOTE — ED Provider Notes (Signed)
[facility] Emergency Department Provider Note ____________________________________________   Event Date/Time   First MD Initiated Contact with Patient 04/22/21 1050     (approximate)  I have reviewed the triage vital signs and the nursing notes.  HISTORY  Chief Complaint Fall   HPI Christie Holmes is a 87 y.o. femalewho presents to the ED for evaluation of fall and scalp laceration.   Chart review indicates hx HTN, HLD.   Patient reports being in her typical state of health until a mechanical fall today.  She reports grocery shopping by herself at [employer], when she was bringing her cart out to her car when she got tripped up and fell backwards striking her head on the ground.  Denies syncope, but reports immediate pain and "seeing stars."  Denies subsequent syncope, emesis and denies additional trauma or pain beyond the back of her scalp.  Reporting 7/10 intensity headache  Husband at the bedside indicates that she seems fine now.   Past Medical History:  Diagnosis Date  . Asthma   . Hypercholesteremia   . Hypertension     There are no problems to display for this patient.   Past Surgical History:  Procedure Laterality Date  . ABDOMINAL HYSTERECTOMY    . HERNIA REPAIR      Prior to Admission medications   Medication Sig Start Date End Date Taking? Authorizing Provider  ondansetron (ZOFRAN ODT) 4 MG disintegrating tablet Take 1 tablet (4 mg total) by mouth every 8 (eight) hours as needed for nausea or vomiting. 03/14/16   Irean Hong, MD    Allergies Patient has no known allergies.  Family History  Problem Relation Age of Onset  . Breast cancer Neg Hx     Social History Social History   Tobacco Use  . Smoking status: Never Smoker  . Smokeless tobacco: Never Used  Substance Use Topics  . Alcohol use: No  . Drug use: No    Review of Systems  Constitutional: No fever/chills Eyes: No visual changes. ENT: No sore  throat. Cardiovascular: Denies chest pain. Respiratory: Denies shortness of breath. Gastrointestinal: No abdominal pain.  No nausea, no vomiting.  No diarrhea.  No constipation. Genitourinary: Negative for dysuria. Musculoskeletal: Negative for back pain. Skin: Negative for rash. Neurological: Negative for  focal weakness or numbness.  Positive for posttraumatic headache  ____________________________________________   PHYSICAL EXAM:  VITAL SIGNS: Vitals:   04/22/21 1054 04/22/21 1200  BP: (!) 176/78 (!) 159/77  Pulse: 69 71  Resp: 14 17  Temp: 98.3 F (36.8 C)   SpO2: 98% 98%     Constitutional: Alert and oriented. Well appearing and in no acute distress.  Blood on her clothes.  Pleasant and making jokes. Eyes: Conjunctivae are normal. PERRL. EOMI. Head: Tiny 5 mm laceration to the left-sided occiput with significant surrounding matted blood to her hair, hemostatic Nose: No congestion/rhinnorhea. Mouth/Throat: Mucous membranes are moist.  Oropharynx non-erythematous. Neck: No stridor. No cervical spine tenderness to palpation. Cardiovascular: Normal rate, regular rhythm. Grossly normal heart sounds.  Good peripheral circulation. Respiratory: Normal respiratory effort.  No retractions. Lungs CTAB. Gastrointestinal: Soft , nondistended, nontender to palpation. No CVA tenderness. Musculoskeletal: No lower extremity tenderness nor edema.  No joint effusions. No signs of acute trauma. Neurologic:  Normal speech and language. No gross focal neurologic deficits are appreciated.  Cranial nerves II through XII intact 5/5 strength and sensation in all 4 extremities Skin:  Skin is warm, dry and intact. No  rash noted. Psychiatric: Mood and affect are normal. Speech and behavior are normal.  ____________________________________________   LABS (all labs ordered are listed, but only abnormal results are displayed)  Labs Reviewed  URINALYSIS, COMPLETE (UACMP) WITH MICROSCOPIC -  Abnormal; Notable for the following components:      Result Value   Color, Urine STRAW (*)    APPearance CLEAR (*)    All other components within normal limits  BASIC METABOLIC PANEL  CBC  PROTIME-INR   ____________________________________________  12 Lead EKG  Sinus rhythm, rate of 71 bpm.  Normal axis and intervals.  Poor R wave progression but no evidence of acute ischemia. ____________________________________________  RADIOLOGY  ED MD interpretation: CT head reviewed by me without evidence of skull fracture or ICH  Official radiology report(s): CT HEAD WO CONTRAST  Result Date: 04/22/2021 CLINICAL DATA:  Facial trauma.  Head injury EXAM: CT HEAD WITHOUT CONTRAST TECHNIQUE: Contiguous axial images were obtained from the base of the skull through the vertex without intravenous contrast. COMPARISON:  02/17/2010 FINDINGS: Brain: No evidence of acute infarction, hemorrhage, hydrocephalus, extra-axial collection or mass lesion/mass effect. Age normal appearance of the brain. Vascular: No hyperdense vessel or unexpected calcification. Skull: Scalp injury with gas and hemorrhage at the vertex. No underlying calvarial fracture or opaque foreign body. Sinuses/Orbits: Bilateral cataract resection. IMPRESSION: 1. No evidence of intracranial injury. 2. Scalp hematoma without fracture. Electronically Signed   By: Marnee Spring M.D.   On: 04/22/2021 11:23    ____________________________________________   PROCEDURES and INTERVENTIONS  Procedure(s) performed (including Critical Care):  .1-3 Lead EKG Interpretation Performed by: Delton Prairie, MD Authorized by: Delton Prairie, MD     Interpretation: normal     ECG rate:  70   ECG rate assessment: normal     Rhythm: sinus rhythm     Ectopy: none     Conduction: normal   ..Laceration Repair  Date/Time: 04/22/2021 12:57 PM Performed by: Delton Prairie, MD Authorized by: Delton Prairie, MD   Consent:    Consent obtained:  Verbal   Consent  given by:  Patient and spouse   Risks, benefits, and alternatives were discussed: yes     Risks discussed:  Infection, pain, poor cosmetic result and need for additional repair Anesthesia:    Anesthesia method:  None Laceration details:    Location:  Scalp   Scalp location:  Occipital   Length (cm):  0.5 Exploration:    Hemostasis achieved with:  Direct pressure   Imaging obtained comment:  CT   Imaging outcome: foreign body not noted     Contaminated: no   Treatment:    Area cleansed with:  Chlorhexidine   Amount of cleaning:  Standard   Irrigation solution:  Sterile saline   Visualized foreign bodies/material removed: no     Debridement:  None   Undermining:  None   Scar revision: no   Skin repair:    Repair method:  Staples   Number of staples:  1 Approximation:    Approximation:  Close Repair type:    Repair type:  Simple Post-procedure details:    Dressing:  Open (no dressing)   Procedure completion:  Tolerated well, no immediate complications    Medications  acetaminophen (TYLENOL) tablet 1,000 mg (1,000 mg Oral Given 04/22/21 1214)    ____________________________________________   MDM / ED COURSE   87 year old woman presents to the ED after mechanical fall causing head injury and scalp laceration, amenable to outpatient management.  Exam reassuring without  evidence of neurologic or vascular deficits, no ongoing bleeding.  She is a small laceration to her occiput with associated small hematoma, but no bony step-offs.  She is clinically well-appearing.  Work-up is benign without evidence of ICH, medical pathology to cause this fall, UTI.  EKG is nonischemic without evidence of interval changes to cause cardiogenic syncope.  Laceration repaired with single staple, well-tolerated, and we will discharge with return precautions.  Discussed outpatient management  Clinical Course as of 04/22/21 1256  Wed Apr 22, 2021  1235 1 staple applied by me.  Well-tolerated [DS]     Clinical Course User Index [DS] Delton Prairie, MD    ____________________________________________   FINAL CLINICAL IMPRESSION(S) / ED DIAGNOSES  Final diagnoses:  Fall, initial encounter  Laceration of scalp, initial encounter     ED Discharge Orders    None       Tarren Sabree Katrinka Blazing   Note:  This document was prepared using Dragon voice recognition software and may include unintentional dictation errors.   Delton Prairie, MD 04/22/21 1259

## 2021-05-01 DIAGNOSIS — Z4802 Encounter for removal of sutures: Secondary | ICD-10-CM | POA: Diagnosis not present

## 2021-05-21 DIAGNOSIS — E538 Deficiency of other specified B group vitamins: Secondary | ICD-10-CM | POA: Diagnosis not present

## 2021-05-21 DIAGNOSIS — E785 Hyperlipidemia, unspecified: Secondary | ICD-10-CM | POA: Diagnosis not present

## 2021-05-21 DIAGNOSIS — I10 Essential (primary) hypertension: Secondary | ICD-10-CM | POA: Diagnosis not present

## 2021-05-28 DIAGNOSIS — J45901 Unspecified asthma with (acute) exacerbation: Secondary | ICD-10-CM | POA: Diagnosis not present

## 2021-05-28 DIAGNOSIS — Z0000 Encounter for general adult medical examination without abnormal findings: Secondary | ICD-10-CM | POA: Diagnosis not present

## 2021-05-28 DIAGNOSIS — F3341 Major depressive disorder, recurrent, in partial remission: Secondary | ICD-10-CM | POA: Diagnosis not present

## 2021-05-28 DIAGNOSIS — I700 Atherosclerosis of aorta: Secondary | ICD-10-CM | POA: Diagnosis not present

## 2021-05-28 DIAGNOSIS — E785 Hyperlipidemia, unspecified: Secondary | ICD-10-CM | POA: Diagnosis not present

## 2021-05-28 DIAGNOSIS — I10 Essential (primary) hypertension: Secondary | ICD-10-CM | POA: Diagnosis not present

## 2021-08-19 DIAGNOSIS — Z947 Corneal transplant status: Secondary | ICD-10-CM | POA: Diagnosis not present

## 2021-10-06 DIAGNOSIS — M7671 Peroneal tendinitis, right leg: Secondary | ICD-10-CM | POA: Diagnosis not present

## 2021-10-06 DIAGNOSIS — E539 Vitamin B deficiency, unspecified: Secondary | ICD-10-CM | POA: Diagnosis not present

## 2021-11-19 DIAGNOSIS — I700 Atherosclerosis of aorta: Secondary | ICD-10-CM | POA: Diagnosis not present

## 2021-11-19 DIAGNOSIS — E785 Hyperlipidemia, unspecified: Secondary | ICD-10-CM | POA: Diagnosis not present

## 2021-11-19 DIAGNOSIS — Z0000 Encounter for general adult medical examination without abnormal findings: Secondary | ICD-10-CM | POA: Diagnosis not present

## 2021-11-26 DIAGNOSIS — F3341 Major depressive disorder, recurrent, in partial remission: Secondary | ICD-10-CM | POA: Diagnosis not present

## 2021-11-26 DIAGNOSIS — E538 Deficiency of other specified B group vitamins: Secondary | ICD-10-CM | POA: Diagnosis not present

## 2021-11-26 DIAGNOSIS — I700 Atherosclerosis of aorta: Secondary | ICD-10-CM | POA: Diagnosis not present

## 2021-11-26 DIAGNOSIS — J069 Acute upper respiratory infection, unspecified: Secondary | ICD-10-CM | POA: Diagnosis not present

## 2021-11-26 DIAGNOSIS — E785 Hyperlipidemia, unspecified: Secondary | ICD-10-CM | POA: Diagnosis not present

## 2021-11-26 DIAGNOSIS — I10 Essential (primary) hypertension: Secondary | ICD-10-CM | POA: Diagnosis not present

## 2022-02-17 DIAGNOSIS — Z961 Presence of intraocular lens: Secondary | ICD-10-CM | POA: Diagnosis not present

## 2022-04-01 DIAGNOSIS — L02211 Cutaneous abscess of abdominal wall: Secondary | ICD-10-CM | POA: Diagnosis not present

## 2022-04-01 DIAGNOSIS — I10 Essential (primary) hypertension: Secondary | ICD-10-CM | POA: Diagnosis not present

## 2022-04-08 DIAGNOSIS — I700 Atherosclerosis of aorta: Secondary | ICD-10-CM | POA: Diagnosis not present

## 2022-04-08 DIAGNOSIS — F3341 Major depressive disorder, recurrent, in partial remission: Secondary | ICD-10-CM | POA: Diagnosis not present

## 2022-04-08 DIAGNOSIS — L03311 Cellulitis of abdominal wall: Secondary | ICD-10-CM | POA: Diagnosis not present

## 2022-06-14 DIAGNOSIS — E538 Deficiency of other specified B group vitamins: Secondary | ICD-10-CM | POA: Diagnosis not present

## 2022-06-14 DIAGNOSIS — I700 Atherosclerosis of aorta: Secondary | ICD-10-CM | POA: Diagnosis not present

## 2022-06-21 DIAGNOSIS — F3341 Major depressive disorder, recurrent, in partial remission: Secondary | ICD-10-CM | POA: Diagnosis not present

## 2022-06-21 DIAGNOSIS — Z0000 Encounter for general adult medical examination without abnormal findings: Secondary | ICD-10-CM | POA: Diagnosis not present

## 2022-06-21 DIAGNOSIS — I10 Essential (primary) hypertension: Secondary | ICD-10-CM | POA: Diagnosis not present

## 2022-06-21 DIAGNOSIS — R350 Frequency of micturition: Secondary | ICD-10-CM | POA: Diagnosis not present

## 2022-06-21 DIAGNOSIS — I700 Atherosclerosis of aorta: Secondary | ICD-10-CM | POA: Diagnosis not present

## 2022-06-21 DIAGNOSIS — J45901 Unspecified asthma with (acute) exacerbation: Secondary | ICD-10-CM | POA: Diagnosis not present

## 2022-07-05 DIAGNOSIS — R053 Chronic cough: Secondary | ICD-10-CM | POA: Diagnosis not present

## 2022-07-05 DIAGNOSIS — R0609 Other forms of dyspnea: Secondary | ICD-10-CM | POA: Diagnosis not present

## 2022-07-05 DIAGNOSIS — J309 Allergic rhinitis, unspecified: Secondary | ICD-10-CM | POA: Diagnosis not present

## 2022-07-05 DIAGNOSIS — R0600 Dyspnea, unspecified: Secondary | ICD-10-CM | POA: Diagnosis not present

## 2022-08-16 DIAGNOSIS — Z961 Presence of intraocular lens: Secondary | ICD-10-CM | POA: Diagnosis not present

## 2022-11-02 DIAGNOSIS — R0602 Shortness of breath: Secondary | ICD-10-CM | POA: Diagnosis not present

## 2022-11-02 DIAGNOSIS — I700 Atherosclerosis of aorta: Secondary | ICD-10-CM | POA: Diagnosis not present

## 2022-12-15 DIAGNOSIS — R7309 Other abnormal glucose: Secondary | ICD-10-CM | POA: Diagnosis not present

## 2022-12-15 DIAGNOSIS — J45901 Unspecified asthma with (acute) exacerbation: Secondary | ICD-10-CM | POA: Diagnosis not present

## 2022-12-15 DIAGNOSIS — I10 Essential (primary) hypertension: Secondary | ICD-10-CM | POA: Diagnosis not present

## 2022-12-22 DIAGNOSIS — F3341 Major depressive disorder, recurrent, in partial remission: Secondary | ICD-10-CM | POA: Diagnosis not present

## 2022-12-22 DIAGNOSIS — J45901 Unspecified asthma with (acute) exacerbation: Secondary | ICD-10-CM | POA: Diagnosis not present

## 2022-12-22 DIAGNOSIS — E785 Hyperlipidemia, unspecified: Secondary | ICD-10-CM | POA: Diagnosis not present

## 2022-12-22 DIAGNOSIS — R7303 Prediabetes: Secondary | ICD-10-CM | POA: Diagnosis not present

## 2022-12-22 DIAGNOSIS — I10 Essential (primary) hypertension: Secondary | ICD-10-CM | POA: Diagnosis not present

## 2022-12-22 DIAGNOSIS — I700 Atherosclerosis of aorta: Secondary | ICD-10-CM | POA: Diagnosis not present

## 2022-12-22 DIAGNOSIS — J329 Chronic sinusitis, unspecified: Secondary | ICD-10-CM | POA: Diagnosis not present

## 2022-12-22 DIAGNOSIS — E538 Deficiency of other specified B group vitamins: Secondary | ICD-10-CM | POA: Diagnosis not present

## 2023-01-18 ENCOUNTER — Other Ambulatory Visit: Payer: Self-pay | Admitting: Unknown Physician Specialty

## 2023-01-18 ENCOUNTER — Ambulatory Visit
Admission: RE | Admit: 2023-01-18 | Discharge: 2023-01-18 | Disposition: A | Discharge status: Home / Self Care | Payer: PPO | Source: Ambulatory Visit | Attending: Unknown Physician Specialty | Admitting: Unknown Physician Specialty

## 2023-01-18 DIAGNOSIS — J329 Chronic sinusitis, unspecified: Secondary | ICD-10-CM

## 2023-01-18 DIAGNOSIS — R059 Cough, unspecified: Secondary | ICD-10-CM | POA: Diagnosis not present

## 2023-01-27 DIAGNOSIS — M79604 Pain in right leg: Secondary | ICD-10-CM | POA: Diagnosis not present

## 2023-01-27 DIAGNOSIS — M79651 Pain in right thigh: Secondary | ICD-10-CM | POA: Diagnosis not present

## 2023-02-02 DIAGNOSIS — M5136 Other intervertebral disc degeneration, lumbar region: Secondary | ICD-10-CM | POA: Diagnosis not present

## 2023-02-02 DIAGNOSIS — M1611 Unilateral primary osteoarthritis, right hip: Secondary | ICD-10-CM | POA: Diagnosis not present

## 2023-02-02 DIAGNOSIS — M25551 Pain in right hip: Secondary | ICD-10-CM | POA: Diagnosis not present

## 2023-02-02 DIAGNOSIS — G8929 Other chronic pain: Secondary | ICD-10-CM | POA: Diagnosis not present

## 2023-02-02 DIAGNOSIS — M47816 Spondylosis without myelopathy or radiculopathy, lumbar region: Secondary | ICD-10-CM | POA: Diagnosis not present

## 2023-02-23 DIAGNOSIS — Z961 Presence of intraocular lens: Secondary | ICD-10-CM | POA: Diagnosis not present

## 2023-02-23 DIAGNOSIS — Z947 Corneal transplant status: Secondary | ICD-10-CM | POA: Diagnosis not present

## 2023-04-12 DIAGNOSIS — J301 Allergic rhinitis due to pollen: Secondary | ICD-10-CM | POA: Diagnosis not present

## 2023-04-12 DIAGNOSIS — J4520 Mild intermittent asthma, uncomplicated: Secondary | ICD-10-CM | POA: Diagnosis not present

## 2023-04-12 DIAGNOSIS — R053 Chronic cough: Secondary | ICD-10-CM | POA: Diagnosis not present

## 2023-06-14 DIAGNOSIS — R053 Chronic cough: Secondary | ICD-10-CM | POA: Diagnosis not present

## 2023-06-16 DIAGNOSIS — E785 Hyperlipidemia, unspecified: Secondary | ICD-10-CM | POA: Diagnosis not present

## 2023-06-16 DIAGNOSIS — R7303 Prediabetes: Secondary | ICD-10-CM | POA: Diagnosis not present

## 2023-06-16 DIAGNOSIS — I10 Essential (primary) hypertension: Secondary | ICD-10-CM | POA: Diagnosis not present

## 2023-06-23 DIAGNOSIS — Z0000 Encounter for general adult medical examination without abnormal findings: Secondary | ICD-10-CM | POA: Diagnosis not present

## 2023-06-23 DIAGNOSIS — F3341 Major depressive disorder, recurrent, in partial remission: Secondary | ICD-10-CM | POA: Diagnosis not present

## 2023-06-23 DIAGNOSIS — J45901 Unspecified asthma with (acute) exacerbation: Secondary | ICD-10-CM | POA: Diagnosis not present

## 2023-06-23 DIAGNOSIS — R7303 Prediabetes: Secondary | ICD-10-CM | POA: Diagnosis not present

## 2023-06-23 DIAGNOSIS — I700 Atherosclerosis of aorta: Secondary | ICD-10-CM | POA: Diagnosis not present

## 2023-06-23 DIAGNOSIS — I10 Essential (primary) hypertension: Secondary | ICD-10-CM | POA: Diagnosis not present

## 2023-07-19 DIAGNOSIS — J9811 Atelectasis: Secondary | ICD-10-CM | POA: Diagnosis not present

## 2023-07-19 DIAGNOSIS — J301 Allergic rhinitis due to pollen: Secondary | ICD-10-CM | POA: Diagnosis not present

## 2023-07-19 DIAGNOSIS — R053 Chronic cough: Secondary | ICD-10-CM | POA: Diagnosis not present

## 2023-07-19 DIAGNOSIS — K449 Diaphragmatic hernia without obstruction or gangrene: Secondary | ICD-10-CM | POA: Diagnosis not present

## 2023-07-19 DIAGNOSIS — R0602 Shortness of breath: Secondary | ICD-10-CM | POA: Diagnosis not present

## 2023-08-10 DIAGNOSIS — R270 Ataxia, unspecified: Secondary | ICD-10-CM | POA: Diagnosis not present

## 2023-08-11 ENCOUNTER — Other Ambulatory Visit: Payer: Self-pay | Admitting: Internal Medicine

## 2023-08-11 ENCOUNTER — Ambulatory Visit
Admission: RE | Admit: 2023-08-11 | Discharge: 2023-08-11 | Disposition: A | Discharge status: Home / Self Care | Payer: PPO | Source: Ambulatory Visit | Attending: Internal Medicine | Admitting: Internal Medicine

## 2023-08-11 DIAGNOSIS — R2681 Unsteadiness on feet: Secondary | ICD-10-CM | POA: Diagnosis not present

## 2023-08-11 DIAGNOSIS — R296 Repeated falls: Secondary | ICD-10-CM | POA: Diagnosis not present

## 2023-08-11 DIAGNOSIS — R270 Ataxia, unspecified: Secondary | ICD-10-CM | POA: Diagnosis not present

## 2023-08-18 DIAGNOSIS — I700 Atherosclerosis of aorta: Secondary | ICD-10-CM | POA: Diagnosis not present

## 2023-08-18 DIAGNOSIS — F3341 Major depressive disorder, recurrent, in partial remission: Secondary | ICD-10-CM | POA: Diagnosis not present

## 2023-08-18 DIAGNOSIS — M48062 Spinal stenosis, lumbar region with neurogenic claudication: Secondary | ICD-10-CM | POA: Diagnosis not present

## 2023-08-18 DIAGNOSIS — R7303 Prediabetes: Secondary | ICD-10-CM | POA: Diagnosis not present

## 2023-08-18 DIAGNOSIS — E538 Deficiency of other specified B group vitamins: Secondary | ICD-10-CM | POA: Diagnosis not present

## 2023-08-18 DIAGNOSIS — I10 Essential (primary) hypertension: Secondary | ICD-10-CM | POA: Diagnosis not present

## 2023-08-18 DIAGNOSIS — J45901 Unspecified asthma with (acute) exacerbation: Secondary | ICD-10-CM | POA: Diagnosis not present

## 2023-08-29 DIAGNOSIS — Z947 Corneal transplant status: Secondary | ICD-10-CM | POA: Diagnosis not present

## 2023-08-29 DIAGNOSIS — Z961 Presence of intraocular lens: Secondary | ICD-10-CM | POA: Diagnosis not present

## 2023-11-17 DIAGNOSIS — J4521 Mild intermittent asthma with (acute) exacerbation: Secondary | ICD-10-CM | POA: Diagnosis not present

## 2023-11-17 DIAGNOSIS — Z96611 Presence of right artificial shoulder joint: Secondary | ICD-10-CM | POA: Diagnosis not present

## 2024-02-08 DIAGNOSIS — R7303 Prediabetes: Secondary | ICD-10-CM | POA: Diagnosis not present

## 2024-02-08 DIAGNOSIS — I10 Essential (primary) hypertension: Secondary | ICD-10-CM | POA: Diagnosis not present

## 2024-02-15 DIAGNOSIS — I700 Atherosclerosis of aorta: Secondary | ICD-10-CM | POA: Diagnosis not present

## 2024-02-15 DIAGNOSIS — I10 Essential (primary) hypertension: Secondary | ICD-10-CM | POA: Diagnosis not present

## 2024-02-15 DIAGNOSIS — J4521 Mild intermittent asthma with (acute) exacerbation: Secondary | ICD-10-CM | POA: Diagnosis not present

## 2024-02-15 DIAGNOSIS — F3341 Major depressive disorder, recurrent, in partial remission: Secondary | ICD-10-CM | POA: Diagnosis not present

## 2024-02-15 DIAGNOSIS — E538 Deficiency of other specified B group vitamins: Secondary | ICD-10-CM | POA: Diagnosis not present

## 2024-02-24 DIAGNOSIS — R0989 Other specified symptoms and signs involving the circulatory and respiratory systems: Secondary | ICD-10-CM | POA: Diagnosis not present

## 2024-02-24 DIAGNOSIS — R053 Chronic cough: Secondary | ICD-10-CM | POA: Diagnosis not present

## 2024-02-24 DIAGNOSIS — R1310 Dysphagia, unspecified: Secondary | ICD-10-CM | POA: Diagnosis not present

## 2024-02-27 DIAGNOSIS — Z961 Presence of intraocular lens: Secondary | ICD-10-CM | POA: Diagnosis not present

## 2024-02-27 DIAGNOSIS — Z947 Corneal transplant status: Secondary | ICD-10-CM | POA: Diagnosis not present

## 2024-03-06 ENCOUNTER — Ambulatory Visit

## 2024-03-13 ENCOUNTER — Ambulatory Visit

## 2024-03-15 ENCOUNTER — Ambulatory Visit

## 2024-03-20 ENCOUNTER — Ambulatory Visit

## 2024-03-22 ENCOUNTER — Encounter

## 2024-03-27 ENCOUNTER — Encounter

## 2024-03-29 ENCOUNTER — Encounter

## 2024-04-03 ENCOUNTER — Encounter

## 2024-04-05 ENCOUNTER — Encounter

## 2024-04-10 ENCOUNTER — Encounter

## 2024-04-12 ENCOUNTER — Encounter

## 2024-04-17 ENCOUNTER — Encounter

## 2024-04-19 ENCOUNTER — Encounter

## 2024-04-24 ENCOUNTER — Encounter

## 2024-04-26 ENCOUNTER — Encounter

## 2024-05-01 ENCOUNTER — Encounter

## 2024-05-03 ENCOUNTER — Encounter

## 2024-05-08 ENCOUNTER — Encounter

## 2024-05-10 ENCOUNTER — Encounter

## 2024-05-15 ENCOUNTER — Encounter

## 2024-05-15 DIAGNOSIS — R1310 Dysphagia, unspecified: Secondary | ICD-10-CM | POA: Diagnosis not present

## 2024-05-15 DIAGNOSIS — M549 Dorsalgia, unspecified: Secondary | ICD-10-CM | POA: Diagnosis not present

## 2024-05-15 DIAGNOSIS — M48062 Spinal stenosis, lumbar region with neurogenic claudication: Secondary | ICD-10-CM | POA: Diagnosis not present

## 2024-05-15 DIAGNOSIS — M5489 Other dorsalgia: Secondary | ICD-10-CM | POA: Diagnosis not present

## 2024-05-15 DIAGNOSIS — M5430 Sciatica, unspecified side: Secondary | ICD-10-CM | POA: Diagnosis not present

## 2024-05-17 ENCOUNTER — Encounter

## 2024-05-22 ENCOUNTER — Encounter

## 2024-05-22 ENCOUNTER — Other Ambulatory Visit: Payer: Self-pay | Admitting: Specialist

## 2024-05-22 DIAGNOSIS — R1310 Dysphagia, unspecified: Secondary | ICD-10-CM

## 2024-05-24 ENCOUNTER — Encounter

## 2024-05-28 ENCOUNTER — Ambulatory Visit
Admission: RE | Admit: 2024-05-28 | Discharge: 2024-05-28 | Disposition: A | Discharge status: Home / Self Care | Source: Ambulatory Visit | Attending: Specialist | Admitting: Specialist

## 2024-05-28 DIAGNOSIS — R1310 Dysphagia, unspecified: Secondary | ICD-10-CM | POA: Diagnosis not present

## 2024-05-28 DIAGNOSIS — R9089 Other abnormal findings on diagnostic imaging of central nervous system: Secondary | ICD-10-CM | POA: Diagnosis not present

## 2024-05-28 NOTE — Therapy (Signed)
 Modified Barium Swallow Study  Patient Details  Name: Christie Holmes MRN: [id number] Date of Birth: October 28, 1934  Today's Date: 05/28/2024  Modified Barium Swallow completed.  Full report located under Chart Review in the Imaging Section.  History of Present Illness 89 y.o. female who presents for MBSS given complaints of persistent cough and globus sensation with both dry and particulate solids. Barium Swallow 04/10/19, Nonspecific esophageal dysmotility. Small hiatal hernia and minor  esophageal reflux. Negative for obstruction or significant stricture.   Clinical Impression Pt demonstrated intact oropharyngeal swallow function  for age. Pt with mildly prolonged, but functional, piecemeal deglutition of solids and trace lingual and palatal residual with solids. Oral deficits likely due to presence of upper and lower dentures. Pharyngeally, pt with swallow initiation at the pyriform sinus which is Christie Holmes Christie Holmes for age. Possible esophageal dysphagia given pt's complaints of globus sensation and observed minimal barium retention in the thoracic esophagus during esophageal sweep. Pt with hx of GERD, dymotility, and hiatel hernia. Consider GI consult. Pt OK to continue a regular diet with thin liquids with standard aspiration and reflux precautions. Factors that may increase risk of adverse event in presence of aspiration Christie Holmes & Christie Holmes 2021):  n/a  Swallow Evaluation Recommendations Recommendations: PO diet PO Diet Recommendation: Regular;Thin liquids (Level 0) Liquid Administration via: Spoon;Cup;Straw Medication Administration: Whole meds with liquid Supervision: Patient able to self-feed Swallowing strategies  : Slow rate;Small bites/sips;Follow solids with liquids Postural changes: Position pt fully upright for meals;Out of bed for meals (upright 60 minutes after POs) Oral care recommendations: Oral care BID (2x/day) Recommended consults: Consider GI consultation     Christie Holmes, M.S.,  CCC-SLP Speech-Language Pathologist [facility] [phone] (ASCOM)  Christie Holmes 05/28/2024,1:50 PM

## 2024-05-29 ENCOUNTER — Encounter

## 2024-05-31 ENCOUNTER — Encounter

## 2024-06-05 ENCOUNTER — Encounter

## 2024-06-05 DIAGNOSIS — K5909 Other constipation: Secondary | ICD-10-CM | POA: Diagnosis not present

## 2024-06-05 DIAGNOSIS — R1319 Other dysphagia: Secondary | ICD-10-CM | POA: Diagnosis not present

## 2024-06-07 ENCOUNTER — Encounter

## 2024-06-12 ENCOUNTER — Encounter

## 2024-06-13 ENCOUNTER — Ambulatory Visit
Admission: RE | Admit: 2024-06-13 | Discharge: 2024-06-13 | Disposition: A | Discharge status: Home / Self Care | Attending: Gastroenterology | Admitting: Gastroenterology

## 2024-06-13 ENCOUNTER — Encounter
Admission: RE | Disposition: A | Discharge status: Home / Self Care | Payer: Self-pay | Source: Home / Self Care | Attending: Gastroenterology

## 2024-06-13 ENCOUNTER — Other Ambulatory Visit: Payer: Self-pay

## 2024-06-13 ENCOUNTER — Ambulatory Visit: Admitting: Anesthesiology

## 2024-06-13 ENCOUNTER — Encounter: Payer: Self-pay | Admitting: Gastroenterology

## 2024-06-13 DIAGNOSIS — R1310 Dysphagia, unspecified: Secondary | ICD-10-CM | POA: Insufficient documentation

## 2024-06-13 DIAGNOSIS — Z7951 Long term (current) use of inhaled steroids: Secondary | ICD-10-CM | POA: Insufficient documentation

## 2024-06-13 DIAGNOSIS — Z7982 Long term (current) use of aspirin: Secondary | ICD-10-CM | POA: Diagnosis not present

## 2024-06-13 DIAGNOSIS — Z79899 Other long term (current) drug therapy: Secondary | ICD-10-CM | POA: Diagnosis not present

## 2024-06-13 DIAGNOSIS — I10 Essential (primary) hypertension: Secondary | ICD-10-CM | POA: Diagnosis not present

## 2024-06-13 DIAGNOSIS — J45909 Unspecified asthma, uncomplicated: Secondary | ICD-10-CM | POA: Diagnosis not present

## 2024-06-13 DIAGNOSIS — E7800 Pure hypercholesterolemia, unspecified: Secondary | ICD-10-CM | POA: Insufficient documentation

## 2024-06-13 HISTORY — PX: ESOPHAGOGASTRODUODENOSCOPY: SHX5428

## 2024-06-13 HISTORY — DX: Other specified postprocedural states: Z98.890

## 2024-06-13 SURGERY — EGD (ESOPHAGOGASTRODUODENOSCOPY)
Anesthesia: General

## 2024-06-13 MED ORDER — SODIUM CHLORIDE 0.9 % IV SOLN: INTRAVENOUS | Status: DC

## 2024-06-13 MED ORDER — LIDOCAINE HCL (CARDIAC) PF 100 MG/5ML IV SOSY
PREFILLED_SYRINGE | INTRAVENOUS | Status: DC | PRN
  Administered 2024-06-13: 60 mg via INTRAVENOUS

## 2024-06-13 MED ORDER — PROPOFOL 10 MG/ML IV BOLUS
INTRAVENOUS | Status: DC | PRN
  Administered 2024-06-13: 60 mg via INTRAVENOUS
  Administered 2024-06-13: 10 mg via INTRAVENOUS

## 2024-06-13 MED ORDER — DEXMEDETOMIDINE HCL IN NACL 80 MCG/20ML IV SOLN
INTRAVENOUS | Status: DC | PRN
  Administered 2024-06-13: 8 ug via INTRAVENOUS

## 2024-06-13 NOTE — H&P (Signed)
 [name], MD [facility], DHIP [address]  [place], [place] [address]  Main: [phone] Fax:  [phone] Pager: [phone]   Primary Care Physician:  Lenon Layman ORN, MD Primary Gastroenterologist:  Dr. [name]  Pre-Procedure History & Physical: HPI:  Christie Holmes is a 89 y.o. female is here for an endoscopy.   Past Medical History:  Diagnosis Date   Asthma    Hypercholesteremia    Hypercholesteremia    Hypertension    PONV (postoperative nausea and vomiting)     Past Surgical History:  Procedure Laterality Date   ABDOMINAL HYSTERECTOMY     EYE SURGERY     corneal eye surgery   HEMIARTHROPLASTY SHOULDER FRACTURE Left 04/14/2007   HERNIA REPAIR      Prior to Admission medications   Medication Sig Start Date End Date Taking? Authorizing Provider  amLODipine (NORVASC) 2.5 MG tablet Take 2.5 mg by mouth daily.   Yes [provider]  aspirin EC 81 MG tablet Take 81 mg by mouth daily. Swallow whole.   Yes [provider]  calcium carbonate (OSCAL) 1500 (600 Ca) MG TABS tablet Take 1,500 mg by mouth 2 (two) times daily with a meal.   Yes [provider]  escitalopram (LEXAPRO) 10 MG tablet Take 10 mg by mouth daily.   Yes [provider]  fluticasone (FLONASE) 50 MCG/ACT nasal spray Place 2 sprays into both nostrils daily.   Yes [provider]  fluticasone-salmeterol (ADVAIR) 250-50 MCG/ACT AEPB Inhale 1 puff into the lungs in the morning and at bedtime.   Yes [provider]  lactulose (CHRONULAC) 10 GM/15ML solution Take 10 g by mouth 3 (three) times daily.   Yes [provider]  montelukast (SINGULAIR) 10 MG tablet Take 10 mg by mouth at bedtime.   Yes [provider]  pravastatin (PRAVACHOL) 40 MG tablet Take 40 mg by mouth daily.   Yes [provider]  prednisoLONE acetate (PRED FORTE) 1 % ophthalmic suspension 1 drop 4 (four) times  daily.   Yes [provider]  pregabalin (LYRICA) 25 MG capsule Take 25 mg by mouth 2 (two) times daily.   Yes [provider]  ondansetron  (ZOFRAN  ODT) 4 MG disintegrating tablet Take 1 tablet (4 mg total) by mouth every 8 (eight) hours as needed for nausea or vomiting. Patient not taking: Reported on 06/13/2024 03/14/16   Robinette Vermell PARAS, MD    Allergies as of 06/05/2024   (No Known Allergies)    Family History  Problem Relation Age of Onset   Breast cancer Neg Hx     Social History   Socioeconomic History   Marital status: Married    Spouse name: Not on file   Number of children: Not on file   Years of education: Not on file   Highest education level: Not on file  Occupational History   Not on file  Tobacco Use   Smoking status: Never   Smokeless tobacco: Never  Vaping Use   Vaping status: Never Used  Substance and Sexual Activity   Alcohol use: No   Drug use: No   Sexual activity: Not on file  Other Topics Concern   Not on file  Social History Narrative   Not on file   Social Drivers of Health   Financial Resource Strain: Low Risk  (02/24/2024)   Received from [facility]   Overall Financial Resource Strain (CARDIA)  Difficulty of Paying Living Expenses: Not hard at all  Food Insecurity: No Food Insecurity (02/24/2024)   Received from [facility]   Hunger Vital Sign    Within the past 12 months, you worried that your food would run out before you got the money to buy more.: Never true    Within the past 12 months, the food you bought just didn't last and you didn't have money to get more.: Never true  Transportation Needs: No Transportation Needs (02/24/2024)   Received from [facility]    In the past 12 months, has lack of transportation kept you from medical appointments or from getting medications?: No    Lack of Transportation (Non-Medical): No  Physical  Activity: Not on file  Stress: Not on file  Social Connections: Not on file  Intimate Partner Violence: Not on file    Review of Systems: See HPI, otherwise negative ROS  Physical Exam: BP (!) 163/50   Pulse 62   Temp (!) 97.1 F (36.2 C) (Temporal)   Resp 16   Ht 4' 9 (1.448 m)   Wt 60.5 kg   SpO2 100%   BMI 28.87 kg/m  General:   Alert,  pleasant and cooperative in NAD Head:  Normocephalic and atraumatic. Neck:  Supple; no masses or thyromegaly. Lungs:  Clear throughout to auscultation.    Heart:  Regular rate and rhythm. Abdomen:  Soft, nontender and nondistended. Normal bowel sounds, without guarding, and without rebound.   Neurologic:  Alert and  oriented x4;  grossly normal neurologically.  Impression/Plan: Christie Holmes is here for an endoscopy to be performed for dysphagia  Risks, benefits, limitations, and alternatives regarding  endoscopy have been reviewed with the patient.  Questions have been answered.  All parties agreeable.   Corinn Brooklyn, MD  06/13/2024, 9:10 AM

## 2024-06-13 NOTE — Op Note (Signed)
 [facility] Patient Name: Christie Holmes Procedure Date: 06/13/2024 10:00 AM MRN: [id number] Account #: [id number] Date of Birth: 27-Jul-1934 Admit Type: Outpatient Age: 89 Room: [facility] ENDO ROOM 2 Gender: Female Note Status: Finalized Instrument Name: Barnie Endoscope [id number] Procedure:             Upper GI endoscopy Indications:           Dysphagia Providers:             Corinn Jess Brooklyn MD, MD Referring MD:          Layman ORN. Lenon MD, MD (Referring MD) Medicines:             General Anesthesia Complications:         No immediate complications. Estimated blood loss: None. Procedure:             Pre-Anesthesia Assessment:                        - Prior to the procedure, a History and Physical was                         performed, and patient medications and allergies were                         reviewed. The patient is competent. The risks and                         benefits of the procedure and the sedation options and                         risks were discussed with the patient. All questions                         were answered and informed consent was obtained.                         Patient identification and proposed procedure were                         verified by the physician, the nurse, the                         anesthesiologist, the anesthetist and the technician                         in the pre-procedure area in the procedure room in the                         endoscopy suite. Mental Status Examination: alert and                         oriented. Airway Examination: normal oropharyngeal                         airway and neck mobility. Respiratory Examination:                         clear to auscultation. CV Examination: normal.  Prophylactic Antibiotics: The patient does not require                         prophylactic antibiotics. Prior Anticoagulants: The                         patient has  taken no anticoagulant or antiplatelet                         agents. ASA Grade Assessment: II - A patient with mild                         systemic disease. After reviewing the risks and                         benefits, the patient was deemed in satisfactory                         condition to undergo the procedure. The anesthesia                         plan was to use general anesthesia. Immediately prior                         to administration of medications, the patient was                         re-assessed for adequacy to receive sedatives. The                         heart rate, respiratory rate, oxygen saturations,                         blood pressure, adequacy of pulmonary ventilation, and                         response to care were monitored throughout the                         procedure. The physical status of the patient was                         re-assessed after the procedure.                        After obtaining informed consent, the endoscope was                         passed under direct vision. Throughout the procedure,                         the patient's blood pressure, pulse, and oxygen                         saturations were monitored continuously. The Endoscope                         was introduced through the mouth, and advanced to the  second part of duodenum. The upper GI endoscopy was                         accomplished without difficulty. The patient tolerated                         the procedure well. Findings:      The esophagus was normal.      The stomach was normal.      The examined duodenum was normal. Impression:            - Normal esophagus.                        - Normal stomach.                        - Normal examined duodenum.                        - No specimens collected. Recommendation:        - Discharge patient to home (with escort).                        - Chopped diet indefinitely.                         - Continue present medications. Procedure Code(s):     --- Professional ---                        [phone], Esophagogastroduodenoscopy, flexible,                         transoral; diagnostic, including collection of                         specimen(s) by brushing or washing, when performed                         (separate procedure) Diagnosis Code(s):     --- Professional ---                        R13.10, Dysphagia, unspecified CPT copyright 2022 [employer]. All rights reserved. The codes documented in this report are preliminary and upon coder review may  be revised to meet current compliance requirements. Dr. Corinn Brooklyn Corinn Jess Brooklyn MD, MD 06/13/2024 10:27:58 AM This report has been signed electronically. Number of Addenda: 0 Note Initiated On: 06/13/2024 10:00 AM Estimated Blood Loss:  Estimated blood loss: none.      [facility]

## 2024-06-13 NOTE — Anesthesia Postprocedure Evaluation (Signed)
 Anesthesia Post Note  Patient: Christie Holmes  Procedure(s) Performed: EGD (ESOPHAGOGASTRODUODENOSCOPY)  Patient location during evaluation: Endoscopy Anesthesia Type: General Level of consciousness: awake and alert Pain management: pain level controlled Vital Signs Assessment: post-procedure vital signs reviewed and stable Respiratory status: spontaneous breathing, nonlabored ventilation, respiratory function stable and patient connected to nasal cannula oxygen Cardiovascular status: blood pressure returned to baseline and stable Postop Assessment: no apparent nausea or vomiting Anesthetic complications: no   No notable events documented.   Last Vitals:  Vitals:   06/13/24 1049 06/13/24 1059  BP: 135/66 117/63  Pulse: 60 61  Resp: 19 16  Temp:    SpO2: 98% 99%    Last Pain:  Vitals:   06/13/24 1059  TempSrc:   PainSc: 0-No pain                 Prentice Murphy

## 2024-06-13 NOTE — Anesthesia Preprocedure Evaluation (Signed)
 Anesthesia Evaluation  Patient identified by MRN, date of birth, ID band Patient awake    Reviewed: Allergy & Precautions, H&P , NPO status , Patient's Chart, lab work & pertinent test results, reviewed documented beta blocker date and time   History of Anesthesia Complications (+) PONV and history of anesthetic complications  Airway Mallampati: I  TM Distance: >3 FB Neck ROM: full    Dental  (+) Dental Advidsory Given, Edentulous Upper, Edentulous Lower, Upper Dentures, Lower Dentures   Pulmonary neg shortness of breath, asthma , neg COPD, neg recent URI   Pulmonary exam normal breath sounds clear to auscultation       Cardiovascular Exercise Tolerance: Good hypertension, (-) angina (-) Past MI and (-) Cardiac Stents Normal cardiovascular exam(-) dysrhythmias (-) Valvular Problems/Murmurs Rhythm:regular Rate:Normal     Neuro/Psych negative neurological ROS  negative psych ROS   GI/Hepatic negative GI ROS, Neg liver ROS,,,  Endo/Other  negative endocrine ROS    Renal/GU negative Renal ROS  negative genitourinary   Musculoskeletal   Abdominal   Peds  Hematology negative hematology ROS (+)   Anesthesia Other Findings Past Medical History: No date: Asthma No date: Hypercholesteremia No date: Hypercholesteremia No date: Hypertension No date: PONV (postoperative nausea and vomiting)   Reproductive/Obstetrics negative OB ROS                              Anesthesia Physical Anesthesia Plan  ASA: 2  Anesthesia Plan: General   Post-op Pain Management:    Induction: Intravenous  PONV Risk Score and Plan: 4 or greater and Propofol  infusion and TIVA  Airway Management Planned: Natural Airway and Nasal Cannula  Additional Equipment:   Intra-op Plan:   Post-operative Plan:   Informed Consent: I have reviewed the patients History and Physical, chart, labs and discussed the procedure  including the risks, benefits and alternatives for the proposed anesthesia with the patient or authorized representative who has indicated his/her understanding and acceptance.     Dental Advisory Given  Plan Discussed with: Anesthesiologist, CRNA and Surgeon  Anesthesia Plan Comments:          Anesthesia Quick Evaluation

## 2024-06-13 NOTE — Transfer of Care (Signed)
 Immediate Anesthesia Transfer of Care Note  Patient: Christie Holmes  Procedure(s) Performed: EGD (ESOPHAGOGASTRODUODENOSCOPY)  Patient Location: PACU  Anesthesia Type:General  Level of Consciousness: awake, alert , and oriented  Airway & Oxygen Therapy: Patient Spontanous Breathing and Patient connected to nasal cannula oxygen  Post-op Assessment: Report given to RN and Post -op Vital signs reviewed and stable  Post vital signs: Reviewed and stable  Last Vitals:  Vitals Value Taken Time  BP 128/66 06/13/24 10:30  Temp 36.1 C 06/13/24 10:29  Pulse 66 06/13/24 10:30  Resp 22 06/13/24 10:30  SpO2 97 % 06/13/24 10:30  Vitals shown include unfiled device data.  Last Pain:  Vitals:   06/13/24 1029  TempSrc: Tympanic  PainSc: Asleep         Complications: No notable events documented.

## 2024-06-14 ENCOUNTER — Encounter

## 2024-06-19 ENCOUNTER — Encounter

## 2024-06-21 ENCOUNTER — Encounter

## 2024-06-25 DIAGNOSIS — Z1331 Encounter for screening for depression: Secondary | ICD-10-CM | POA: Diagnosis not present

## 2024-06-25 DIAGNOSIS — J301 Allergic rhinitis due to pollen: Secondary | ICD-10-CM | POA: Diagnosis not present

## 2024-06-25 DIAGNOSIS — J4520 Mild intermittent asthma, uncomplicated: Secondary | ICD-10-CM | POA: Diagnosis not present

## 2024-06-25 DIAGNOSIS — D649 Anemia, unspecified: Secondary | ICD-10-CM | POA: Diagnosis not present

## 2024-06-25 DIAGNOSIS — R053 Chronic cough: Secondary | ICD-10-CM | POA: Diagnosis not present

## 2024-07-03 DIAGNOSIS — R300 Dysuria: Secondary | ICD-10-CM | POA: Diagnosis not present

## 2024-07-03 DIAGNOSIS — M48062 Spinal stenosis, lumbar region with neurogenic claudication: Secondary | ICD-10-CM | POA: Diagnosis not present

## 2024-07-03 DIAGNOSIS — F3341 Major depressive disorder, recurrent, in partial remission: Secondary | ICD-10-CM | POA: Diagnosis not present

## 2024-07-03 DIAGNOSIS — I10 Essential (primary) hypertension: Secondary | ICD-10-CM | POA: Diagnosis not present

## 2024-07-03 DIAGNOSIS — E785 Hyperlipidemia, unspecified: Secondary | ICD-10-CM | POA: Diagnosis not present

## 2024-07-03 DIAGNOSIS — R7303 Prediabetes: Secondary | ICD-10-CM | POA: Diagnosis not present

## 2024-07-03 DIAGNOSIS — E538 Deficiency of other specified B group vitamins: Secondary | ICD-10-CM | POA: Diagnosis not present

## 2024-08-01 DIAGNOSIS — M47816 Spondylosis without myelopathy or radiculopathy, lumbar region: Secondary | ICD-10-CM | POA: Diagnosis not present

## 2024-08-01 DIAGNOSIS — M5416 Radiculopathy, lumbar region: Secondary | ICD-10-CM | POA: Diagnosis not present

## 2024-08-01 DIAGNOSIS — M1611 Unilateral primary osteoarthritis, right hip: Secondary | ICD-10-CM | POA: Diagnosis not present

## 2024-09-03 DIAGNOSIS — Z947 Corneal transplant status: Secondary | ICD-10-CM | POA: Diagnosis not present

## 2024-09-03 DIAGNOSIS — I700 Atherosclerosis of aorta: Secondary | ICD-10-CM | POA: Diagnosis not present

## 2024-09-03 DIAGNOSIS — R7303 Prediabetes: Secondary | ICD-10-CM | POA: Diagnosis not present

## 2024-09-03 DIAGNOSIS — Z961 Presence of intraocular lens: Secondary | ICD-10-CM | POA: Diagnosis not present

## 2024-09-03 DIAGNOSIS — E538 Deficiency of other specified B group vitamins: Secondary | ICD-10-CM | POA: Diagnosis not present

## 2024-09-03 DIAGNOSIS — I10 Essential (primary) hypertension: Secondary | ICD-10-CM | POA: Diagnosis not present

## 2024-09-10 DIAGNOSIS — E785 Hyperlipidemia, unspecified: Secondary | ICD-10-CM | POA: Diagnosis not present

## 2024-09-10 DIAGNOSIS — Z0000 Encounter for general adult medical examination without abnormal findings: Secondary | ICD-10-CM | POA: Diagnosis not present

## 2024-09-10 DIAGNOSIS — M48062 Spinal stenosis, lumbar region with neurogenic claudication: Secondary | ICD-10-CM | POA: Diagnosis not present

## 2024-09-10 DIAGNOSIS — E538 Deficiency of other specified B group vitamins: Secondary | ICD-10-CM | POA: Diagnosis not present

## 2024-09-10 DIAGNOSIS — J4521 Mild intermittent asthma with (acute) exacerbation: Secondary | ICD-10-CM | POA: Diagnosis not present

## 2024-09-10 DIAGNOSIS — R7303 Prediabetes: Secondary | ICD-10-CM | POA: Diagnosis not present

## 2024-09-10 DIAGNOSIS — Z1331 Encounter for screening for depression: Secondary | ICD-10-CM | POA: Diagnosis not present

## 2024-09-10 DIAGNOSIS — I10 Essential (primary) hypertension: Secondary | ICD-10-CM | POA: Diagnosis not present

## 2024-10-01 DIAGNOSIS — M5416 Radiculopathy, lumbar region: Secondary | ICD-10-CM | POA: Diagnosis not present

## 2024-10-01 DIAGNOSIS — M47816 Spondylosis without myelopathy or radiculopathy, lumbar region: Secondary | ICD-10-CM | POA: Diagnosis not present

## 2024-10-01 DIAGNOSIS — M1611 Unilateral primary osteoarthritis, right hip: Secondary | ICD-10-CM | POA: Diagnosis not present

## 2024-10-02 ENCOUNTER — Other Ambulatory Visit: Payer: Self-pay | Admitting: Family Medicine

## 2024-10-02 DIAGNOSIS — M5416 Radiculopathy, lumbar region: Secondary | ICD-10-CM
